# Patient Record
Sex: Female | Born: 1945
Health system: Southern US, Community
[De-identification: ages and names within clinical notes are randomized; demographics above are authoritative.]

## PROBLEM LIST (undated history)

## (undated) DIAGNOSIS — Z720 Tobacco use: Secondary | ICD-10-CM

## (undated) DIAGNOSIS — E559 Vitamin D deficiency, unspecified: Secondary | ICD-10-CM

## (undated) DIAGNOSIS — J439 Emphysema, unspecified: Secondary | ICD-10-CM

## (undated) DIAGNOSIS — C801 Malignant (primary) neoplasm, unspecified: Secondary | ICD-10-CM

## (undated) DIAGNOSIS — I700 Atherosclerosis of aorta: Secondary | ICD-10-CM

## (undated) DIAGNOSIS — E7800 Pure hypercholesterolemia, unspecified: Secondary | ICD-10-CM

## (undated) DIAGNOSIS — R222 Localized swelling, mass and lump, trunk: Secondary | ICD-10-CM

## (undated) HISTORY — PX: APPENDECTOMY: SHX54

## (undated) HISTORY — DX: Pure hypercholesterolemia, unspecified: E78.00

## (undated) HISTORY — DX: Emphysema, unspecified: J43.9

## (undated) HISTORY — DX: Localized swelling, mass and lump, trunk: R22.2

## (undated) HISTORY — DX: Atherosclerosis of aorta: I70.0

## (undated) HISTORY — DX: Vitamin D deficiency, unspecified: E55.9

## (undated) HISTORY — DX: Tobacco use: Z72.0

---

## 2008-10-20 HISTORY — PX: COLONOSCOPY: SHX174

## 2011-12-11 ENCOUNTER — Other Ambulatory Visit: Payer: Self-pay | Admitting: Family Medicine

## 2011-12-11 ENCOUNTER — Other Ambulatory Visit (HOSPITAL_COMMUNITY)
Admission: RE | Admit: 2011-12-11 | Discharge: 2011-12-11 | Disposition: A | Discharge status: Home / Self Care | Payer: Medicare Other | Source: Ambulatory Visit | Attending: Family Medicine | Admitting: Family Medicine

## 2011-12-11 DIAGNOSIS — Z124 Encounter for screening for malignant neoplasm of cervix: Secondary | ICD-10-CM | POA: Insufficient documentation

## 2011-12-11 DIAGNOSIS — Z1231 Encounter for screening mammogram for malignant neoplasm of breast: Secondary | ICD-10-CM

## 2011-12-25 ENCOUNTER — Ambulatory Visit
Admission: RE | Admit: 2011-12-25 | Discharge: 2011-12-25 | Disposition: A | Discharge status: Home / Self Care | Payer: Medicare Other | Source: Ambulatory Visit | Attending: Family Medicine | Admitting: Family Medicine

## 2011-12-25 DIAGNOSIS — Z1231 Encounter for screening mammogram for malignant neoplasm of breast: Secondary | ICD-10-CM

## 2013-05-03 ENCOUNTER — Other Ambulatory Visit: Payer: Self-pay

## 2013-05-03 DIAGNOSIS — Z1231 Encounter for screening mammogram for malignant neoplasm of breast: Secondary | ICD-10-CM

## 2013-05-31 ENCOUNTER — Ambulatory Visit: Payer: Medicare Other

## 2013-06-02 ENCOUNTER — Ambulatory Visit
Admission: RE | Admit: 2013-06-02 | Discharge: 2013-06-02 | Disposition: A | Discharge status: Home / Self Care | Payer: Medicare Other | Source: Ambulatory Visit

## 2013-06-02 DIAGNOSIS — Z1231 Encounter for screening mammogram for malignant neoplasm of breast: Secondary | ICD-10-CM

## 2014-05-01 ENCOUNTER — Other Ambulatory Visit: Payer: Self-pay | Admitting: Family Medicine

## 2014-05-01 ENCOUNTER — Other Ambulatory Visit (HOSPITAL_COMMUNITY)
Admission: RE | Admit: 2014-05-01 | Discharge: 2014-05-01 | Disposition: A | Discharge status: Home / Self Care | Payer: Medicare Other | Source: Ambulatory Visit | Attending: Family Medicine | Admitting: Family Medicine

## 2014-05-01 DIAGNOSIS — Z124 Encounter for screening for malignant neoplasm of cervix: Secondary | ICD-10-CM | POA: Insufficient documentation

## 2014-05-01 DIAGNOSIS — Z1151 Encounter for screening for human papillomavirus (HPV): Secondary | ICD-10-CM | POA: Insufficient documentation

## 2014-05-02 LAB — CYTOLOGY - PAP

## 2014-07-14 ENCOUNTER — Other Ambulatory Visit: Payer: Self-pay | Admitting: Gastroenterology

## 2016-05-09 ENCOUNTER — Other Ambulatory Visit: Payer: Self-pay | Admitting: Acute Care

## 2016-05-09 DIAGNOSIS — F17210 Nicotine dependence, cigarettes, uncomplicated: Secondary | ICD-10-CM

## 2016-06-12 ENCOUNTER — Ambulatory Visit (INDEPENDENT_AMBULATORY_CARE_PROVIDER_SITE_OTHER)
Admission: RE | Admit: 2016-06-12 | Discharge: 2016-06-12 | Disposition: A | Discharge status: Home / Self Care | Payer: Medicare Other | Source: Ambulatory Visit | Attending: Acute Care | Admitting: Acute Care

## 2016-06-12 ENCOUNTER — Encounter: Payer: Self-pay | Admitting: Acute Care

## 2016-06-12 ENCOUNTER — Ambulatory Visit (INDEPENDENT_AMBULATORY_CARE_PROVIDER_SITE_OTHER): Payer: Medicare Other | Admitting: Acute Care

## 2016-06-12 DIAGNOSIS — F17210 Nicotine dependence, cigarettes, uncomplicated: Secondary | ICD-10-CM

## 2016-06-12 DIAGNOSIS — Z87891 Personal history of nicotine dependence: Secondary | ICD-10-CM | POA: Diagnosis not present

## 2016-06-12 NOTE — Progress Notes (Signed)
Shared Decision Making Visit Lung Cancer Screening Program [phone])   Eligibility:  Age 70 y.o.  Pack Years Smoking History Calculation 52 pack years (# packs/per year x # years smoked)  Recent History of coughing up blood  no  Unexplained weight loss? no ( >Than 15 pounds within the last 6 months )  Prior History Lung / other cancer no (Diagnosis within the last 5 years already requiring surveillance chest CT Scans).  Smoking Status Current Smoker  Former Smokers: Years since quit: NA  Quit Date: NA  Visit Components:  Discussion included one or more decision making aids. yes  Discussion included risk/benefits of screening. yes  Discussion included potential follow up diagnostic testing for abnormal scans. yes  Discussion included meaning and risk of over diagnosis. yes  Discussion included meaning and risk of False Positives. yes  Discussion included meaning of total radiation exposure. yes  Counseling Included:  Importance of adherence to annual lung cancer LDCT screening. yes  Impact of comorbidities on ability to participate in the program. yes  Ability and willingness to under diagnostic treatment. yes  Smoking Cessation Counseling:  Current Smokers:   Discussed importance of smoking cessation. yes  Information about tobacco cessation classes and interventions provided to patient. yes  Patient provided with "ticket" for LDCT Scan. yes  Symptomatic Patient. no  Counseling  Diagnosis Code: Tobacco Use Z72.0  Asymptomatic Patient yes  Counseling (Intermediate counseling: > three minutes counseling) [id number]  Former Smokers:   Discussed the importance of maintaining cigarette abstinence. yes  Diagnosis Code: Personal History of Nicotine Dependence. [id number]  Information about tobacco cessation classes and interventions provided to patient. Yes  Patient provided with "ticket" for LDCT Scan. yes  Written Order for Lung Cancer Screening with LDCT  placed in Epic. Yes (CT Chest Lung Cancer Screening Low Dose W/O CM) [id number] Z12.2-Screening of respiratory organs Z87.891-Personal history of nicotine dependence  I have spent 20 minutes of face to face time with Ms. Azcarate discussing the risks and benefits of lung cancer screening. We viewed a power point together that explained in detail the above noted topics. We paused at intervals to allow for questions to be asked and answered to ensure understanding.We discussed that the single most powerful action that she can take to decrease her risk of developing lung cancer is to quit smoking. We discussed whether or not she is ready to commit to setting a quit date. She is currently not ready to set a quit date. We discussed options for tools to aid in quitting smoking including nicotine replacement therapy, non-nicotine medications, support groups, Quit Smart classes, and behavior modification. We discussed that often times setting smaller, more achievable goals, such as eliminating 1 cigarette a day for a week and then 2 cigarettes a day for a week can be helpful in slowly decreasing the number of cigarettes smoked. This allows for a sense of accomplishment as well as providing a clinical benefit. I gave her the " Be Stronger Than Your Excuses" card with contact information for community resources, classes, free nicotine replacement therapy, and access to mobile apps, text messaging, and on-line smoking cessation help. I have also given her my card and contact information in the event she needs to contact me. We discussed the time and location of the scan, and that either June Leap, CMA, or I will call with the results within 24-48 hours of receiving them. I have provided her with a copy of the power point we viewed  as a  resource in the event they need reinforcement of the concepts we discussed today in the office. The patient verbalized understanding of all of  the above and had no further questions upon  leaving the office. They have my contact information in the event they have any further questions.   Magdalen Spatz, NP 06/12/16

## 2016-08-06 ENCOUNTER — Telehealth: Payer: Self-pay | Admitting: Acute Care

## 2016-08-06 DIAGNOSIS — F17210 Nicotine dependence, cigarettes, uncomplicated: Secondary | ICD-10-CM

## 2016-08-06 NOTE — Telephone Encounter (Signed)
This is documentation of a phone call made 06/16/2016. Results of the low-dose screening CT were called to Mrs. Olivia Gomez. I explained that her scan was read as a lung RADS 2, indicating nodules that are benign in appearance or behavior. Recommendation per radiology is for continued annual lung cancer screening with low-dose CT in 12 months. We will call Olivia Gomez in August 2018 to schedule her for her annual scan. We discussed that her CT also indicated emphysema, and coronary artery and thoracic aortic atherosclerosis. I explained that the atherosclerosis was a very common finding with all of these scans. I explained that as a non-gated exam we were unable to determine severity or degree. I have told her we will fax the result her primary care provider for completeness of her medical care. Olivia Gomez verbalized understanding of the above and had no further questions at completion of the call. She has my contact information in the event she has questions in the future.

## 2017-05-14 ENCOUNTER — Other Ambulatory Visit: Payer: Self-pay | Admitting: Family Medicine

## 2017-05-14 DIAGNOSIS — Z1231 Encounter for screening mammogram for malignant neoplasm of breast: Secondary | ICD-10-CM

## 2017-05-20 ENCOUNTER — Ambulatory Visit
Admission: RE | Admit: 2017-05-20 | Discharge: 2017-05-20 | Disposition: A | Discharge status: Home / Self Care | Payer: Medicare Other | Source: Ambulatory Visit | Attending: Family Medicine | Admitting: Family Medicine

## 2017-05-20 DIAGNOSIS — Z1231 Encounter for screening mammogram for malignant neoplasm of breast: Secondary | ICD-10-CM

## 2017-06-15 ENCOUNTER — Ambulatory Visit (INDEPENDENT_AMBULATORY_CARE_PROVIDER_SITE_OTHER)
Admission: RE | Admit: 2017-06-15 | Discharge: 2017-06-15 | Disposition: A | Discharge status: Home / Self Care | Payer: Medicare Other | Source: Ambulatory Visit | Attending: Acute Care | Admitting: Acute Care

## 2017-06-15 DIAGNOSIS — F17210 Nicotine dependence, cigarettes, uncomplicated: Secondary | ICD-10-CM

## 2017-06-15 DIAGNOSIS — Z87891 Personal history of nicotine dependence: Secondary | ICD-10-CM

## 2017-06-18 ENCOUNTER — Other Ambulatory Visit: Payer: Self-pay | Admitting: Acute Care

## 2017-06-18 DIAGNOSIS — Z122 Encounter for screening for malignant neoplasm of respiratory organs: Secondary | ICD-10-CM

## 2017-06-18 DIAGNOSIS — F17210 Nicotine dependence, cigarettes, uncomplicated: Secondary | ICD-10-CM

## 2017-09-24 ENCOUNTER — Other Ambulatory Visit: Payer: Self-pay | Admitting: Family Medicine

## 2017-09-24 DIAGNOSIS — R222 Localized swelling, mass and lump, trunk: Secondary | ICD-10-CM

## 2017-10-01 ENCOUNTER — Other Ambulatory Visit: Payer: Medicare Other

## 2017-10-07 ENCOUNTER — Other Ambulatory Visit: Payer: Medicare Other

## 2017-10-15 ENCOUNTER — Ambulatory Visit
Admission: RE | Admit: 2017-10-15 | Discharge: 2017-10-15 | Disposition: A | Discharge status: Home / Self Care | Payer: Medicare Other | Source: Ambulatory Visit | Attending: Family Medicine | Admitting: Family Medicine

## 2017-10-15 DIAGNOSIS — R222 Localized swelling, mass and lump, trunk: Secondary | ICD-10-CM

## 2017-11-04 ENCOUNTER — Other Ambulatory Visit: Payer: Self-pay | Admitting: Family Medicine

## 2017-11-04 DIAGNOSIS — R222 Localized swelling, mass and lump, trunk: Secondary | ICD-10-CM

## 2017-11-11 ENCOUNTER — Ambulatory Visit
Admission: RE | Admit: 2017-11-11 | Discharge: 2017-11-11 | Disposition: A | Discharge status: Home / Self Care | Payer: Medicare HMO | Source: Ambulatory Visit | Attending: Family Medicine | Admitting: Family Medicine

## 2017-11-11 DIAGNOSIS — R918 Other nonspecific abnormal finding of lung field: Secondary | ICD-10-CM | POA: Diagnosis not present

## 2017-11-11 DIAGNOSIS — J439 Emphysema, unspecified: Secondary | ICD-10-CM | POA: Diagnosis not present

## 2017-11-11 DIAGNOSIS — R222 Localized swelling, mass and lump, trunk: Secondary | ICD-10-CM

## 2017-11-11 MED ORDER — IOPAMIDOL (ISOVUE-300) INJECTION 61%
75.0000 mL | Freq: Once | INTRAVENOUS | Status: AC | PRN
  Administered 2017-11-11: 75 mL via INTRAVENOUS

## 2017-11-23 DIAGNOSIS — R69 Illness, unspecified: Secondary | ICD-10-CM | POA: Diagnosis not present

## 2017-12-21 ENCOUNTER — Encounter (INDEPENDENT_AMBULATORY_CARE_PROVIDER_SITE_OTHER): Payer: Self-pay

## 2017-12-21 ENCOUNTER — Ambulatory Visit: Payer: Medicare HMO | Admitting: Interventional Cardiology

## 2017-12-21 ENCOUNTER — Encounter: Payer: Self-pay | Admitting: Interventional Cardiology

## 2017-12-21 VITALS — BP 142/86 | HR 62 | Ht 62.0 in | Wt 124.8 lb

## 2017-12-21 DIAGNOSIS — Z720 Tobacco use: Secondary | ICD-10-CM | POA: Diagnosis not present

## 2017-12-21 DIAGNOSIS — R931 Abnormal findings on diagnostic imaging of heart and coronary circulation: Secondary | ICD-10-CM

## 2017-12-21 DIAGNOSIS — I700 Atherosclerosis of aorta: Secondary | ICD-10-CM

## 2017-12-21 DIAGNOSIS — E785 Hyperlipidemia, unspecified: Secondary | ICD-10-CM | POA: Diagnosis not present

## 2017-12-21 NOTE — Progress Notes (Signed)
Cardiology Office Note    Date:  12/21/2017   ID:  Olivia Gomez, DOB Jan 20, 1946, MRN [id number]  PCP:  Marda Stalker, PA-C  Cardiologist: Sinclair Grooms, MD   Chief Complaint  Patient presents with  . Coronary Artery Disease    Asymptomatic CAD/coronary calcification    History of Present Illness:  Olivia Gomez is a 72 y.o. female with history of 1-2 packs of cigarettes per day for greater than 50 years, hyperlipidemia, elevated blood pressures without diagnosis of hypertension, and family history of CAD referred by Marda Stalker, PA-C for recommendations concerning coronary calcification identified coincidentally when CT of the chest was performed in late 2018.  The patient was referred to Dr. Wynonia Lawman in late 2018 where counseling and an exercise treadmill test was performed.  She was recommended to use aspirin daily, and exercise treadmill test demonstrated mild reduction in exertional tolerance, normal blood pressure response, no evidence of ischemia.  Since that time she has continued to smoke, has decreased aspirin to once a week, and is not in favor of statin therapy.  Statin therapy was recommended by Dr. Wynonia Lawman but not accepted.    Past Medical History:  Diagnosis Date  . Elevated cholesterol   . Emphysema lung ([place])   . Lump in chest   . Tobacco user   . Vitamin D deficiency     History reviewed. No pertinent surgical history.  Current Medications: Outpatient Medications Prior to Visit  Medication Sig Dispense Refill  . Ascorbic Acid (VITAMIN C) 1000 MG tablet Take 1,000 mg by mouth once a week.    Marland Kitchen aspirin EC 81 MG tablet Take 81 mg by mouth 3 (three) times a week.    Marland Kitchen b complex vitamins capsule Take 1 capsule by mouth once a week.    . Cholecalciferol (VITAMIN D) 2000 units CAPS Take 2,000 Units by mouth daily.    . Coenzyme Q10 (COQ10) 100 MG CAPS Take 100 mg by mouth once a week.    . Lactobacillus (ACIDOPHILUS PO) Take 1 capsule by mouth once a  week.     . Magnesium 250 MG TABS Take 250 mg by mouth once a week.     . NON FORMULARY Take 750 mg by mouth once a week. CURAMED    . Omega-3 Fatty Acids (FISH OIL PO) Take 1,200 mg by mouth daily.     Marland Kitchen OVER THE COUNTER MEDICATION Take 500 mg by mouth 2 (two) times daily. CHOLESTOFF    . Coenzyme Q10 (VITALINE COQ10) 60 MG TABS Take by mouth once a week.     No facility-administered medications prior to visit.      Allergies:   Flagyl [metronidazole]   Social History   Socioeconomic History  . Marital status: Unknown    Spouse name: None  . Number of children: None  . Years of education: None  . Highest education level: None  Social Needs  . Financial resource strain: None  . Food insecurity - worry: None  . Food insecurity - inability: None  . Transportation needs - medical: None  . Transportation needs - non-medical: None  Occupational History  . None  Tobacco Use  . Smoking status: Current Every Day Smoker    Packs/day: 1.00    Years: 52.00    Pack years: 52.00    Types: Cigarettes  . Smokeless tobacco: Never Used  Substance and Sexual Activity  . Alcohol use: None  . Drug use: None  . Sexual activity:  None    Comment: DIVORCED  Other Topics Concern  . None  Social History Narrative  . None     Family History:  The patient's family history includes Canavan disease in her maternal grandmother; Cancer in her paternal grandmother; Dementia in her mother; Heart failure in her father.   ROS:   Please see the history of present illness.    Hearing loss.  Otherwise no complaints.  Gardens on a regular basis. All other systems reviewed and are negative.   PHYSICAL EXAM:   VS:  BP (!) 142/86   Pulse 62   Ht 5\' 2"  (1.575 m)   Wt 124 lb 12.8 oz (56.6 kg)   BMI 22.83 kg/m    GEN: Well nourished, well developed, in no acute distress  HEENT: normal  Neck: no JVD, carotid bruits, or masses Cardiac: RRR; no murmurs, rubs, or gallops,no edema  Respiratory:  clear  to auscultation bilaterally, normal work of breathing GI: soft, nontender, nondistended, + BS MS: no deformity or atrophy  Skin: warm and dry, no rash Neuro:  Alert and Oriented x 3, Strength and sensation are intact Psych: euthymic mood, full affect  Wt Readings from Last 3 Encounters:  12/21/17 124 lb 12.8 oz (56.6 kg)      Studies/Labs Reviewed:   EKG:  EKG normal sinus rhythm with no change in EKG compared to records from [place].  Tall R wave in V1 is noted.  Nonspecific ST-T wave abnormality is noted.  Right superior axis was noted.  Recent Labs: No results found for requested labs within last 8760 hours.   Lipid Panel No results found for: CHOL, TRIG, HDL, CHOLHDL, VLDL, LDLCALC, LDLDIRECT  Additional studies/ records that were reviewed today include:  CT scan performed in October 2018 demonstrated aortic atherosclerosis and mild three-vessel coronary calcification    ASSESSMENT:    1. Aortic atherosclerosis ([place])   2. Tobacco abuse   3. Hyperlipidemia with target LDL less than 70   4. Elevated coronary artery calcium score      PLAN:  In order of problems listed above:  1. Noted on CT.  Asymptomatic without symptoms of claudication or other vascular complaints.  Not ready to 2. She is not willing to quit cigarette smoking at this time.  Smoking cessation will decrease her risk for vascular events over time. 3. LDL 123 when checked last summer.  Total cholesterol 230.  Statin therapy would decrease risk of vascular events.  She refuses statin therapy. 4. The presence of coronary calcification led to an exercise treadmill test within the past 4 months which was negative for evidence of ischemia.  No further evaluation is needed.  Risk modification as noted above was recommended.  To summarize, have recommended smoking cessation, statin therapy to LDL of 70 or less, and aspirin therapy at least 3 times per week.  It    Medication Adjustments/Labs and Tests  Ordered: Current medicines are reviewed at length with the patient today.  Concerns regarding medicines are outlined above.  Medication changes, Labs and Tests ordered today are listed in the Patient Instructions below. Patient Instructions  Medication Instructions:  1) INCREASE Aspirin to 81mg  three times weekly  Labwork: None  Testing/Procedures: None  Follow-Up: Your physician recommends that you schedule a follow-up appointment as needed with Dr. Tamala Julian.    Any Other Special Instructions Will Be Listed Below (If Applicable).  We recommend that you get your bad (LDL) cholesterol down to 70 or lower.  Steps to Quit Smoking Smoking tobacco can be harmful to your health and can affect almost every organ in your body. Smoking puts you, and those around you, at risk for developing many serious chronic diseases. Quitting smoking is difficult, but it is one of the best things that you can do for your health. It is never too late to quit. What are the benefits of quitting smoking? When you quit smoking, you lower your risk of developing serious diseases and conditions, such as:  Lung cancer or lung disease, such as COPD.  Heart disease.  Stroke.  Heart attack.  Infertility.  Osteoporosis and bone fractures.  Additionally, symptoms such as coughing, wheezing, and shortness of breath may get better when you quit. You may also find that you get sick less often because your body is stronger at fighting off colds and infections. If you are pregnant, quitting smoking can help to reduce your chances of having a baby of low birth weight. How do I get ready to quit? When you decide to quit smoking, create a plan to make sure that you are successful. Before you quit:  Pick a date to quit. Set a date within the next two weeks to give you time to prepare.  Write down the reasons why you are quitting. Keep this list in places where you will see it often, such as on your bathroom mirror or  in your car or wallet.  Identify the people, places, things, and activities that make you want to smoke (triggers) and avoid them. Make sure to take these actions: ? Throw away all cigarettes at home, at work, and in your car. ? Throw away smoking accessories, such as Scientist, research (medical). ? Clean your car and make sure to empty the ashtray. ? Clean your home, including curtains and carpets.  Tell your family, friends, and coworkers that you are quitting. Support from your loved ones can make quitting easier.  Talk with your health care provider about your options for quitting smoking.  Find out what treatment options are covered by your health insurance.  What strategies can I use to quit smoking? Talk with your healthcare provider about different strategies to quit smoking. Some strategies include:  Quitting smoking altogether instead of gradually lessening how much you smoke over a period of time. Research shows that quitting "cold [place]" is more successful than gradually quitting.  Attending in-person counseling to help you build problem-solving skills. You are more likely to have success in quitting if you attend several counseling sessions. Even short sessions of 10 minutes can be effective.  Finding resources and support systems that can help you to quit smoking and remain smoke-free after you quit. These resources are most helpful when you use them often. They can include: ? Online chats with a Social worker. ? Telephone quitlines. ? Careers information officer. ? Support groups or group counseling. ? Text messaging programs. ? Mobile phone applications.  Taking medicines to help you quit smoking. (If you are pregnant or breastfeeding, talk with your health care provider first.) Some medicines contain nicotine and some do not. Both types of medicines help with cravings, but the medicines that include nicotine help to relieve withdrawal symptoms. Your health care provider may  recommend: ? Nicotine patches, gum, or lozenges. ? Nicotine inhalers or sprays. ? Non-nicotine medicine that is taken by mouth.  Talk with your health care provider about combining strategies, such as taking medicines while you are also receiving in-person counseling. Using these two strategies  together makes you more likely to succeed in quitting than if you used either strategy on its own. If you are pregnant or breastfeeding, talk with your health care provider about finding counseling or other support strategies to quit smoking. Do not take medicine to help you quit smoking unless told to do so by your health care provider. What things can I do to make it easier to quit? Quitting smoking might feel overwhelming at first, but there is a lot that you can do to make it easier. Take these important actions:  Reach out to your family and friends and ask that they support and encourage you during this time. Call telephone quitlines, reach out to support groups, or work with a counselor for support.  Ask people who smoke to avoid smoking around you.  Avoid places that trigger you to smoke, such as bars, parties, or smoke-break areas at work.  Spend time around people who do not smoke.  Lessen stress in your life, because stress can be a smoking trigger for some people. To lessen stress, try: ? Exercising regularly. ? Deep-breathing exercises. ? Yoga. ? Meditating. ? Performing a body scan. This involves closing your eyes, scanning your body from head to toe, and noticing which parts of your body are particularly tense. Purposefully relax the muscles in those areas.  Download or purchase mobile phone or tablet apps (applications) that can help you stick to your quit plan by providing reminders, tips, and encouragement. There are many free apps, such as QuitGuide from the [employer] Office manager for Disease Control and Prevention). You can find other support for quitting smoking (smoking cessation) through  smokefree.gov and other websites.  How will I feel when I quit smoking? Within the first 24 hours of quitting smoking, you may start to feel some withdrawal symptoms. These symptoms are usually most noticeable 2-3 days after quitting, but they usually do not last beyond 2-3 weeks. Changes or symptoms that you might experience include:  Mood swings.  Restlessness, anxiety, or irritation.  Difficulty concentrating.  Dizziness.  Strong cravings for sugary foods in addition to nicotine.  Mild weight gain.  Constipation.  Nausea.  Coughing or a sore throat.  Changes in how your medicines work in your body.  A depressed mood.  Difficulty sleeping (insomnia).  After the first 2-3 weeks of quitting, you may start to notice more positive results, such as:  Improved sense of smell and taste.  Decreased coughing and sore throat.  Slower heart rate.  Lower blood pressure.  Clearer skin.  The ability to breathe more easily.  Fewer sick days.  Quitting smoking is very challenging for most people. Do not get discouraged if you are not successful the first time. Some people need to make many attempts to quit before they achieve long-term success. Do your best to stick to your quit plan, and talk with your health care provider if you have any questions or concerns. This information is not intended to replace advice given to you by your health care provider. Make sure you discuss any questions you have with your health care provider. Document Released: 09/30/2001 Document Revised: 06/03/2016 Document Reviewed: 02/20/2015 Elsevier Interactive Patient Education  Claris Pech [employer]in.    If you need a refill on your cardiac medications before your next appointment, please call your pharmacy.      Signed, Sinclair Grooms, MD  12/21/2017 9:57 AM    [place], [place]  [address] Phone: [phone]; Fax: [phone]

## 2017-12-21 NOTE — Patient Instructions (Signed)
Medication Instructions:  1) INCREASE Aspirin to 81mg  three times weekly  Labwork: None  Testing/Procedures: None  Follow-Up: Your physician recommends that you schedule a follow-up appointment as needed with Dr. Tamala Julian.    Any Other Special Instructions Will Be Listed Below (If Applicable).  We recommend that you get your bad (LDL) cholesterol down to 70 or lower.     Steps to Quit Smoking Smoking tobacco can be harmful to your health and can affect almost every organ in your body. Smoking puts you, and those around you, at risk for developing many serious chronic diseases. Quitting smoking is difficult, but it is one of the best things that you can do for your health. It is never too late to quit. What are the benefits of quitting smoking? When you quit smoking, you lower your risk of developing serious diseases and conditions, such as:  Lung cancer or lung disease, such as COPD.  Heart disease.  Stroke.  Heart attack.  Infertility.  Osteoporosis and bone fractures.  Additionally, symptoms such as coughing, wheezing, and shortness of breath may get better when you quit. You may also find that you get sick less often because your body is stronger at fighting off colds and infections. If you are pregnant, quitting smoking can help to reduce your chances of having a baby of low birth weight. How do I get ready to quit? When you decide to quit smoking, create a plan to make sure that you are successful. Before you quit:  Pick a date to quit. Set a date within the next two weeks to give you time to prepare.  Write down the reasons why you are quitting. Keep this list in places where you will see it often, such as on your bathroom mirror or in your car or wallet.  Identify the people, places, things, and activities that make you want to smoke (triggers) and avoid them. Make sure to take these actions: ? Throw away all cigarettes at home, at work, and in your car. ? Throw away  smoking accessories, such as Scientist, research (medical). ? Clean your car and make sure to empty the ashtray. ? Clean your home, including curtains and carpets.  Tell your family, friends, and coworkers that you are quitting. Support from your loved ones can make quitting easier.  Talk with your health care provider about your options for quitting smoking.  Find out what treatment options are covered by your health insurance.  What strategies can I use to quit smoking? Talk with your healthcare provider about different strategies to quit smoking. Some strategies include:  Quitting smoking altogether instead of gradually lessening how much you smoke over a period of time. Research shows that quitting "cold [place]" is more successful than gradually quitting.  Attending in-person counseling to help you build problem-solving skills. You are more likely to have success in quitting if you attend several counseling sessions. Even short sessions of 10 minutes can be effective.  Finding resources and support systems that can help you to quit smoking and remain smoke-free after you quit. These resources are most helpful when you use them often. They can include: ? Online chats with a Social worker. ? Telephone quitlines. ? Careers information officer. ? Support groups or group counseling. ? Text messaging programs. ? Mobile phone applications.  Taking medicines to help you quit smoking. (If you are pregnant or breastfeeding, talk with your health care provider first.) Some medicines contain nicotine and some do not. Both types of medicines  help with cravings, but the medicines that include nicotine help to relieve withdrawal symptoms. Your health care provider may recommend: ? Nicotine patches, gum, or lozenges. ? Nicotine inhalers or sprays. ? Non-nicotine medicine that is taken by mouth.  Talk with your health care provider about combining strategies, such as taking medicines while you are also  receiving in-person counseling. Using these two strategies together makes you more likely to succeed in quitting than if you used either strategy on its own. If you are pregnant or breastfeeding, talk with your health care provider about finding counseling or other support strategies to quit smoking. Do not take medicine to help you quit smoking unless told to do so by your health care provider. What things can I do to make it easier to quit? Quitting smoking might feel overwhelming at first, but there is a lot that you can do to make it easier. Take these important actions:  Reach out to your family and friends and ask that they support and encourage you during this time. Call telephone quitlines, reach out to support groups, or work with a counselor for support.  Ask people who smoke to avoid smoking around you.  Avoid places that trigger you to smoke, such as bars, parties, or smoke-break areas at work.  Spend time around people who do not smoke.  Lessen stress in your life, because stress can be a smoking trigger for some people. To lessen stress, try: ? Exercising regularly. ? Deep-breathing exercises. ? Yoga. ? Meditating. ? Performing a body scan. This involves closing your eyes, scanning your body from head to toe, and noticing which parts of your body are particularly tense. Purposefully relax the muscles in those areas.  Download or purchase mobile phone or tablet apps (applications) that can help you stick to your quit plan by providing reminders, tips, and encouragement. There are many free apps, such as QuitGuide from the [employer] Office manager for Disease Control and Prevention). You can find other support for quitting smoking (smoking cessation) through smokefree.gov and other websites.  How will I feel when I quit smoking? Within the first 24 hours of quitting smoking, you may start to feel some withdrawal symptoms. These symptoms are usually most noticeable 2-3 days after quitting, but  they usually do not last beyond 2-3 weeks. Changes or symptoms that you might experience include:  Mood swings.  Restlessness, anxiety, or irritation.  Difficulty concentrating.  Dizziness.  Strong cravings for sugary foods in addition to nicotine.  Mild weight gain.  Constipation.  Nausea.  Coughing or a sore throat.  Changes in how your medicines work in your body.  A depressed mood.  Difficulty sleeping (insomnia).  After the first 2-3 weeks of quitting, you may start to notice more positive results, such as:  Improved sense of smell and taste.  Decreased coughing and sore throat.  Slower heart rate.  Lower blood pressure.  Clearer skin.  The ability to breathe more easily.  Fewer sick days.  Quitting smoking is very challenging for most people. Do not get discouraged if you are not successful the first time. Some people need to make many attempts to quit before they achieve long-term success. Do your best to stick to your quit plan, and talk with your health care provider if you have any questions or concerns. This information is not intended to replace advice given to you by your health care provider. Make sure you discuss any questions you have with your health care provider. Document Released:  09/30/2001 Document Revised: 06/03/2016 Document Reviewed: 02/20/2015 Elsevier Interactive Patient Education  [employer].    If you need a refill on your cardiac medications before your next appointment, please call your pharmacy.

## 2018-03-11 DIAGNOSIS — H2513 Age-related nuclear cataract, bilateral: Secondary | ICD-10-CM | POA: Diagnosis not present

## 2018-03-11 DIAGNOSIS — H524 Presbyopia: Secondary | ICD-10-CM | POA: Diagnosis not present

## 2018-05-11 DIAGNOSIS — R69 Illness, unspecified: Secondary | ICD-10-CM | POA: Diagnosis not present

## 2018-05-19 DIAGNOSIS — J439 Emphysema, unspecified: Secondary | ICD-10-CM | POA: Diagnosis not present

## 2018-05-19 DIAGNOSIS — Z1211 Encounter for screening for malignant neoplasm of colon: Secondary | ICD-10-CM | POA: Diagnosis not present

## 2018-05-19 DIAGNOSIS — E559 Vitamin D deficiency, unspecified: Secondary | ICD-10-CM | POA: Diagnosis not present

## 2018-05-19 DIAGNOSIS — E7800 Pure hypercholesterolemia, unspecified: Secondary | ICD-10-CM | POA: Diagnosis not present

## 2018-05-19 DIAGNOSIS — Z720 Tobacco use: Secondary | ICD-10-CM | POA: Diagnosis not present

## 2018-05-19 DIAGNOSIS — Z131 Encounter for screening for diabetes mellitus: Secondary | ICD-10-CM | POA: Diagnosis not present

## 2018-05-19 DIAGNOSIS — Z0000 Encounter for general adult medical examination without abnormal findings: Secondary | ICD-10-CM | POA: Diagnosis not present

## 2018-06-16 ENCOUNTER — Ambulatory Visit
Admission: RE | Admit: 2018-06-16 | Discharge: 2018-06-16 | Disposition: A | Discharge status: Home / Self Care | Payer: Medicare HMO | Source: Ambulatory Visit | Attending: Acute Care | Admitting: Acute Care

## 2018-06-16 DIAGNOSIS — R69 Illness, unspecified: Secondary | ICD-10-CM | POA: Diagnosis not present

## 2018-06-16 DIAGNOSIS — F17210 Nicotine dependence, cigarettes, uncomplicated: Secondary | ICD-10-CM

## 2018-06-16 DIAGNOSIS — Z122 Encounter for screening for malignant neoplasm of respiratory organs: Secondary | ICD-10-CM

## 2018-06-22 ENCOUNTER — Other Ambulatory Visit: Payer: Self-pay | Admitting: Acute Care

## 2018-06-22 DIAGNOSIS — Z122 Encounter for screening for malignant neoplasm of respiratory organs: Secondary | ICD-10-CM

## 2018-06-22 DIAGNOSIS — F17210 Nicotine dependence, cigarettes, uncomplicated: Secondary | ICD-10-CM

## 2018-07-08 DIAGNOSIS — Z23 Encounter for immunization: Secondary | ICD-10-CM | POA: Diagnosis not present

## 2018-08-06 DIAGNOSIS — R0781 Pleurodynia: Secondary | ICD-10-CM | POA: Diagnosis not present

## 2018-09-22 DIAGNOSIS — H2513 Age-related nuclear cataract, bilateral: Secondary | ICD-10-CM | POA: Diagnosis not present

## 2018-11-01 DIAGNOSIS — R69 Illness, unspecified: Secondary | ICD-10-CM | POA: Diagnosis not present

## 2018-11-06 DIAGNOSIS — Z0100 Encounter for examination of eyes and vision without abnormal findings: Secondary | ICD-10-CM | POA: Diagnosis not present

## 2019-05-04 DIAGNOSIS — R69 Illness, unspecified: Secondary | ICD-10-CM | POA: Diagnosis not present

## 2019-06-21 DIAGNOSIS — R69 Illness, unspecified: Secondary | ICD-10-CM | POA: Diagnosis not present

## 2019-06-21 DIAGNOSIS — H524 Presbyopia: Secondary | ICD-10-CM | POA: Diagnosis not present

## 2019-08-11 DIAGNOSIS — D229 Melanocytic nevi, unspecified: Secondary | ICD-10-CM | POA: Diagnosis not present

## 2019-08-11 DIAGNOSIS — Z1211 Encounter for screening for malignant neoplasm of colon: Secondary | ICD-10-CM | POA: Diagnosis not present

## 2019-08-11 DIAGNOSIS — Z0000 Encounter for general adult medical examination without abnormal findings: Secondary | ICD-10-CM | POA: Diagnosis not present

## 2019-08-11 DIAGNOSIS — E559 Vitamin D deficiency, unspecified: Secondary | ICD-10-CM | POA: Diagnosis not present

## 2019-08-11 DIAGNOSIS — E7800 Pure hypercholesterolemia, unspecified: Secondary | ICD-10-CM | POA: Diagnosis not present

## 2019-08-11 DIAGNOSIS — J439 Emphysema, unspecified: Secondary | ICD-10-CM | POA: Diagnosis not present

## 2019-08-11 DIAGNOSIS — Z720 Tobacco use: Secondary | ICD-10-CM | POA: Diagnosis not present

## 2019-09-05 DIAGNOSIS — L821 Other seborrheic keratosis: Secondary | ICD-10-CM | POA: Diagnosis not present

## 2019-11-10 DIAGNOSIS — R69 Illness, unspecified: Secondary | ICD-10-CM | POA: Diagnosis not present

## 2020-01-08 DIAGNOSIS — R109 Unspecified abdominal pain: Secondary | ICD-10-CM | POA: Diagnosis not present

## 2020-01-08 DIAGNOSIS — R1084 Generalized abdominal pain: Secondary | ICD-10-CM | POA: Diagnosis not present

## 2020-01-26 ENCOUNTER — Other Ambulatory Visit: Payer: Self-pay | Admitting: Gastroenterology

## 2020-01-26 DIAGNOSIS — D72118 Other hypereosinophilic syndrome: Secondary | ICD-10-CM

## 2020-01-26 DIAGNOSIS — Z86010 Personal history of colonic polyps: Secondary | ICD-10-CM | POA: Diagnosis not present

## 2020-01-26 DIAGNOSIS — R109 Unspecified abdominal pain: Secondary | ICD-10-CM | POA: Diagnosis not present

## 2020-01-26 DIAGNOSIS — R197 Diarrhea, unspecified: Secondary | ICD-10-CM | POA: Diagnosis not present

## 2020-01-26 DIAGNOSIS — R198 Other specified symptoms and signs involving the digestive system and abdomen: Secondary | ICD-10-CM | POA: Diagnosis not present

## 2020-01-26 DIAGNOSIS — R700 Elevated erythrocyte sedimentation rate: Secondary | ICD-10-CM

## 2020-01-26 DIAGNOSIS — R7989 Other specified abnormal findings of blood chemistry: Secondary | ICD-10-CM

## 2020-01-26 DIAGNOSIS — R194 Change in bowel habit: Secondary | ICD-10-CM

## 2020-01-30 ENCOUNTER — Ambulatory Visit
Admission: RE | Admit: 2020-01-30 | Discharge: 2020-01-30 | Disposition: A | Discharge status: Home / Self Care | Payer: Medicare HMO | Source: Ambulatory Visit | Attending: Gastroenterology | Admitting: Gastroenterology

## 2020-01-30 ENCOUNTER — Other Ambulatory Visit: Payer: Self-pay

## 2020-01-30 DIAGNOSIS — D72118 Other hypereosinophilic syndrome: Secondary | ICD-10-CM

## 2020-01-30 DIAGNOSIS — R109 Unspecified abdominal pain: Secondary | ICD-10-CM

## 2020-01-30 DIAGNOSIS — R194 Change in bowel habit: Secondary | ICD-10-CM

## 2020-01-30 DIAGNOSIS — R700 Elevated erythrocyte sedimentation rate: Secondary | ICD-10-CM

## 2020-01-30 DIAGNOSIS — R7989 Other specified abnormal findings of blood chemistry: Secondary | ICD-10-CM

## 2020-01-30 DIAGNOSIS — R197 Diarrhea, unspecified: Secondary | ICD-10-CM | POA: Diagnosis not present

## 2020-01-30 MED ORDER — IOPAMIDOL (ISOVUE-300) INJECTION 61%
100.0000 mL | Freq: Once | INTRAVENOUS | Status: AC | PRN
  Administered 2020-01-30: 10:00:00 100 mL via INTRAVENOUS

## 2020-01-31 ENCOUNTER — Other Ambulatory Visit (HOSPITAL_COMMUNITY): Payer: Self-pay | Admitting: Gastroenterology

## 2020-01-31 ENCOUNTER — Telehealth (HOSPITAL_COMMUNITY): Payer: Self-pay

## 2020-01-31 DIAGNOSIS — R188 Other ascites: Secondary | ICD-10-CM

## 2020-01-31 DIAGNOSIS — R197 Diarrhea, unspecified: Secondary | ICD-10-CM | POA: Diagnosis not present

## 2020-01-31 NOTE — Telephone Encounter (Signed)
-----   Message from Greggory Keen, MD sent at 01/30/2020  3:37 PM EDT ----- Regarding: RE: [place] for CT drain (left transgluteal approach)  Ts  ----- Message ----- From: Danielle Dess Sent: 01/30/2020   2:36 PM EDT To: [facility] Procedure Requests Subject: Drain Placement                                Procedure: Diverticular abscess drain placement  Dx: 4.5cm fluid collection  Ordering: Dr. Ronnette Juniper - [phone]  Imaging: CT abd/pelvis done today, 01/30/20  Please review.  Thanks, Lia Foyer

## 2020-02-02 DIAGNOSIS — K5792 Diverticulitis of intestine, part unspecified, without perforation or abscess without bleeding: Secondary | ICD-10-CM | POA: Diagnosis not present

## 2020-02-09 ENCOUNTER — Other Ambulatory Visit: Payer: Medicare HMO

## 2020-02-10 ENCOUNTER — Other Ambulatory Visit: Payer: Self-pay | Admitting: Gastroenterology

## 2020-02-10 DIAGNOSIS — K5720 Diverticulitis of large intestine with perforation and abscess without bleeding: Secondary | ICD-10-CM

## 2020-02-14 ENCOUNTER — Other Ambulatory Visit: Payer: Medicare HMO

## 2020-02-14 ENCOUNTER — Ambulatory Visit
Admission: RE | Admit: 2020-02-14 | Discharge: 2020-02-14 | Disposition: A | Discharge status: Home / Self Care | Payer: Medicare HMO | Source: Ambulatory Visit | Attending: Gastroenterology | Admitting: Gastroenterology

## 2020-02-14 DIAGNOSIS — K5732 Diverticulitis of large intestine without perforation or abscess without bleeding: Secondary | ICD-10-CM | POA: Diagnosis not present

## 2020-02-14 DIAGNOSIS — K5720 Diverticulitis of large intestine with perforation and abscess without bleeding: Secondary | ICD-10-CM

## 2020-02-14 MED ORDER — IOPAMIDOL (ISOVUE-300) INJECTION 61%
100.0000 mL | Freq: Once | INTRAVENOUS | Status: AC | PRN
  Administered 2020-02-14: 100 mL via INTRAVENOUS

## 2020-02-16 ENCOUNTER — Other Ambulatory Visit: Payer: Self-pay | Admitting: Gastroenterology

## 2020-02-16 DIAGNOSIS — K630 Abscess of intestine: Secondary | ICD-10-CM

## 2020-02-16 DIAGNOSIS — K639 Disease of intestine, unspecified: Secondary | ICD-10-CM

## 2020-02-20 DIAGNOSIS — R69 Illness, unspecified: Secondary | ICD-10-CM | POA: Diagnosis not present

## 2020-03-05 ENCOUNTER — Encounter (HOSPITAL_COMMUNITY): Payer: Self-pay | Admitting: Gastroenterology

## 2020-03-05 ENCOUNTER — Inpatient Hospital Stay (HOSPITAL_COMMUNITY)
Admission: EM | Admit: 2020-03-05 | Discharge: 2020-03-08 | DRG: 392 | Disposition: A | Discharge status: Home / Self Care | Payer: Medicare HMO | Attending: Internal Medicine | Admitting: Internal Medicine

## 2020-03-05 ENCOUNTER — Ambulatory Visit
Admission: RE | Admit: 2020-03-05 | Discharge: 2020-03-05 | Disposition: A | Discharge status: Home / Self Care | Payer: Medicare HMO | Source: Ambulatory Visit | Attending: Gastroenterology | Admitting: Gastroenterology

## 2020-03-05 DIAGNOSIS — E559 Vitamin D deficiency, unspecified: Secondary | ICD-10-CM | POA: Diagnosis not present

## 2020-03-05 DIAGNOSIS — Z20822 Contact with and (suspected) exposure to covid-19: Secondary | ICD-10-CM | POA: Diagnosis not present

## 2020-03-05 DIAGNOSIS — R69 Illness, unspecified: Secondary | ICD-10-CM | POA: Diagnosis not present

## 2020-03-05 DIAGNOSIS — E162 Hypoglycemia, unspecified: Secondary | ICD-10-CM | POA: Diagnosis not present

## 2020-03-05 DIAGNOSIS — Z86010 Personal history of colonic polyps: Secondary | ICD-10-CM | POA: Diagnosis not present

## 2020-03-05 DIAGNOSIS — E785 Hyperlipidemia, unspecified: Secondary | ICD-10-CM | POA: Diagnosis present

## 2020-03-05 DIAGNOSIS — K5792 Diverticulitis of intestine, part unspecified, without perforation or abscess without bleeding: Secondary | ICD-10-CM | POA: Diagnosis not present

## 2020-03-05 DIAGNOSIS — Z883 Allergy status to other anti-infective agents status: Secondary | ICD-10-CM

## 2020-03-05 DIAGNOSIS — N1330 Unspecified hydronephrosis: Secondary | ICD-10-CM | POA: Diagnosis present

## 2020-03-05 DIAGNOSIS — F17210 Nicotine dependence, cigarettes, uncomplicated: Secondary | ICD-10-CM | POA: Diagnosis present

## 2020-03-05 DIAGNOSIS — K5720 Diverticulitis of large intestine with perforation and abscess without bleeding: Secondary | ICD-10-CM | POA: Diagnosis not present

## 2020-03-05 DIAGNOSIS — L0291 Cutaneous abscess, unspecified: Secondary | ICD-10-CM

## 2020-03-05 DIAGNOSIS — Z7982 Long term (current) use of aspirin: Secondary | ICD-10-CM | POA: Diagnosis not present

## 2020-03-05 DIAGNOSIS — R933 Abnormal findings on diagnostic imaging of other parts of digestive tract: Secondary | ICD-10-CM | POA: Diagnosis not present

## 2020-03-05 DIAGNOSIS — D72829 Elevated white blood cell count, unspecified: Secondary | ICD-10-CM

## 2020-03-05 DIAGNOSIS — K630 Abscess of intestine: Secondary | ICD-10-CM | POA: Diagnosis not present

## 2020-03-05 DIAGNOSIS — Z800 Family history of malignant neoplasm of digestive organs: Secondary | ICD-10-CM | POA: Diagnosis not present

## 2020-03-05 DIAGNOSIS — K5732 Diverticulitis of large intestine without perforation or abscess without bleeding: Secondary | ICD-10-CM | POA: Diagnosis not present

## 2020-03-05 DIAGNOSIS — K639 Disease of intestine, unspecified: Secondary | ICD-10-CM

## 2020-03-05 DIAGNOSIS — E872 Acidosis: Secondary | ICD-10-CM | POA: Diagnosis not present

## 2020-03-05 DIAGNOSIS — J439 Emphysema, unspecified: Secondary | ICD-10-CM | POA: Diagnosis present

## 2020-03-05 DIAGNOSIS — Z03818 Encounter for observation for suspected exposure to other biological agents ruled out: Secondary | ICD-10-CM | POA: Diagnosis not present

## 2020-03-05 DIAGNOSIS — K5700 Diverticulitis of small intestine with perforation and abscess without bleeding: Secondary | ICD-10-CM | POA: Diagnosis not present

## 2020-03-05 DIAGNOSIS — Z79899 Other long term (current) drug therapy: Secondary | ICD-10-CM

## 2020-03-05 LAB — CBC
HCT: 35 % — ABNORMAL LOW (ref 36.0–46.0)
Hemoglobin: 11.3 g/dL — ABNORMAL LOW (ref 12.0–15.0)
MCH: 30.9 pg (ref 26.0–34.0)
MCHC: 32.3 g/dL (ref 30.0–36.0)
MCV: 95.6 fL (ref 80.0–100.0)
Platelets: 404 10*3/uL — ABNORMAL HIGH (ref 150–400)
RBC: 3.66 MIL/uL — ABNORMAL LOW (ref 3.87–5.11)
RDW: 13.4 % (ref 11.5–15.5)
WBC: 18.3 10*3/uL — ABNORMAL HIGH (ref 4.0–10.5)
nRBC: 0 % (ref 0.0–0.2)

## 2020-03-05 LAB — CBC WITH DIFFERENTIAL/PLATELET
Abs Immature Granulocytes: 0.05 10*3/uL (ref 0.00–0.07)
Basophils Absolute: 0.1 10*3/uL (ref 0.0–0.1)
Basophils Relative: 0 %
Eosinophils Absolute: 0.1 10*3/uL (ref 0.0–0.5)
Eosinophils Relative: 1 %
HCT: 37.7 % (ref 36.0–46.0)
Hemoglobin: 12.2 g/dL (ref 12.0–15.0)
Immature Granulocytes: 0 %
Lymphocytes Relative: 11 %
Lymphs Abs: 1.9 10*3/uL (ref 0.7–4.0)
MCH: 31.1 pg (ref 26.0–34.0)
MCHC: 32.4 g/dL (ref 30.0–36.0)
MCV: 96.2 fL (ref 80.0–100.0)
Monocytes Absolute: 1.6 10*3/uL — ABNORMAL HIGH (ref 0.1–1.0)
Monocytes Relative: 9 %
Neutro Abs: 14 10*3/uL — ABNORMAL HIGH (ref 1.7–7.7)
Neutrophils Relative %: 79 %
Platelets: 434 10*3/uL — ABNORMAL HIGH (ref 150–400)
RBC: 3.92 MIL/uL (ref 3.87–5.11)
RDW: 13.5 % (ref 11.5–15.5)
WBC: 17.7 10*3/uL — ABNORMAL HIGH (ref 4.0–10.5)
nRBC: 0 % (ref 0.0–0.2)

## 2020-03-05 LAB — CREATININE, SERUM
Creatinine, Ser: 0.65 mg/dL (ref 0.44–1.00)
GFR calc Af Amer: >60 mL/min (ref >60)
GFR calc non Af Amer: >60 mL/min (ref >60)

## 2020-03-05 LAB — COMPREHENSIVE METABOLIC PANEL
ALT: 14 U/L (ref 0–44)
AST: 21 U/L (ref 15–41)
Albumin: 3.9 g/dL (ref 3.5–5.0)
Alkaline Phosphatase: 76 U/L (ref 38–126)
Anion gap: 10 (ref 5–15)
BUN: 10 mg/dL (ref 8–23)
CO2: 26 mmol/L (ref 22–32)
Calcium: 9.5 mg/dL (ref 8.9–10.3)
Chloride: 100 mmol/L (ref 98–111)
Creatinine, Ser: 0.58 mg/dL (ref 0.44–1.00)
GFR calc Af Amer: >60 mL/min (ref >60)
GFR calc non Af Amer: >60 mL/min (ref >60)
Glucose, Bld: 104 mg/dL — ABNORMAL HIGH (ref 70–99)
Potassium: 3.4 mmol/L — ABNORMAL LOW (ref 3.5–5.1)
Sodium: 136 mmol/L (ref 135–145)
Total Bilirubin: 0.6 mg/dL (ref 0.3–1.2)
Total Protein: 7.2 g/dL (ref 6.5–8.1)

## 2020-03-05 LAB — SARS CORONAVIRUS 2 BY RT PCR (HOSPITAL ORDER, PERFORMED IN ~~LOC~~ HOSPITAL LAB): SARS Coronavirus 2: NEGATIVE

## 2020-03-05 MED ORDER — IOPAMIDOL (ISOVUE-300) INJECTION 61%
100.0000 mL | Freq: Once | INTRAVENOUS | Status: AC | PRN
  Administered 2020-03-05: 100 mL via INTRAVENOUS

## 2020-03-05 MED ORDER — ONDANSETRON HCL 4 MG/2ML IJ SOLN: 4.0000 mg | Freq: Four times a day (QID) | INTRAMUSCULAR | Status: DC | PRN

## 2020-03-05 MED ORDER — ACETAMINOPHEN 325 MG PO TABS: 650.0000 mg | ORAL_TABLET | Freq: Four times a day (QID) | ORAL | Status: DC | PRN

## 2020-03-05 MED ORDER — ENOXAPARIN SODIUM 40 MG/0.4ML ~~LOC~~ SOLN
40.0000 mg | SUBCUTANEOUS | Status: DC
  Administered 2020-03-05 – 2020-03-07 (×3): 40 mg via SUBCUTANEOUS
  Filled 2020-03-05 (×3): qty 0.4

## 2020-03-05 MED ORDER — PIPERACILLIN-TAZOBACTAM 3.375 G IVPB 30 MIN
3.3750 g | Freq: Once | INTRAVENOUS | Status: AC
  Administered 2020-03-05: 3.375 g via INTRAVENOUS
  Filled 2020-03-05: qty 50

## 2020-03-05 MED ORDER — ACETAMINOPHEN 650 MG RE SUPP: 650.0000 mg | Freq: Four times a day (QID) | RECTAL | Status: DC | PRN

## 2020-03-05 MED ORDER — PIPERACILLIN-TAZOBACTAM 3.375 G IVPB
3.3750 g | Freq: Three times a day (TID) | INTRAVENOUS | Status: DC
  Administered 2020-03-06 – 2020-03-08 (×8): 3.375 g via INTRAVENOUS
  Filled 2020-03-05 (×9): qty 50

## 2020-03-05 MED ORDER — NICOTINE 21 MG/24HR TD PT24: 21.0000 mg | MEDICATED_PATCH | Freq: Every day | TRANSDERMAL | Status: DC

## 2020-03-05 MED ORDER — SODIUM CHLORIDE 0.9 % IV SOLN: INTRAVENOUS | Status: DC

## 2020-03-05 MED ORDER — ONDANSETRON HCL 4 MG PO TABS: 4.0000 mg | ORAL_TABLET | Freq: Four times a day (QID) | ORAL | Status: DC | PRN

## 2020-03-05 MED ORDER — POTASSIUM CHLORIDE 10 MEQ/100ML IV SOLN
10.0000 meq | Freq: Once | INTRAVENOUS | Status: AC
  Administered 2020-03-05: 10 meq via INTRAVENOUS
  Filled 2020-03-05: qty 100

## 2020-03-05 NOTE — ED Notes (Signed)
Pt ambulatory to bathroom, standby assistance.  

## 2020-03-05 NOTE — H&P (Signed)
TRH H&P    Patient Demographics:    Olivia Gomez, is a 74 y.o. female  MRN: [id number]  DOB - [birth year]  Admit Date - 03/05/2020  Referring MD/NP/PA: Madalyn Rob  Outpatient Primary MD for the patient is Marda Stalker, PA-C  Patient coming from: Home  Chief complaint-abnormal CT scan   HPI:    Olivia Gomez  is a 74 y.o. female, with medical history of hyperlipidemia, tobacco use, emphysema came to hospital with abnormal CT scan, patient has been having intermittent abdominal cramping since March.  She has been followed by [facility].  CT scan showed diverticulitis/colitis possible abscess formation.  She completed course of antibiotics Augmentin.  Repeat CT was improving.  However CT scan performed today shows worsening perforation with abscess formation.  She was called by the GI office and told patient to go to the ED for further evaluation. Today in the ED patient denies any symptoms.  Denies nausea vomiting or diarrhea.  Had 3 small BM this morning, formed stool after she had CT scan of the abdomen. She denies fever or chills. Denies chest pain or shortness of breath. Denies dysuria  In the ED, patient was started on IV Zosyn.  Gastroenterology was consulted.     Review of systems:    In addition to the HPI above,    All other systems reviewed and are negative.    Past History of the following :    Past Medical History:  Diagnosis Date  . Elevated cholesterol   . Emphysema lung ([place])   . Lump in chest   . Tobacco user   . Vitamin D deficiency       History reviewed. No pertinent surgical history.    Social History:      Social History   Tobacco Use  . Smoking status: Current Every Day Smoker    Packs/day: 1.00    Years: 52.00    Pack years: 52.00    Types: Cigarettes  . Smokeless tobacco: Never Used  Substance Use Topics  . Alcohol use: Not on file       Family  History :     Family History  Problem Relation Age of Onset  . Dementia Mother   . Heart failure Father   . [place]avan disease Maternal Grandmother        BREAST  . Cancer Paternal Grandmother        INTESTINAL      Home Medications:   Prior to Admission medications   Medication Sig Start Date End Date Taking? Authorizing Provider  Ascorbic Acid (VITAMIN C) 1000 MG tablet Take 1,000 mg by mouth once a week.    [provider]  aspirin EC 81 MG tablet Take 81 mg by mouth 3 (three) times a week.    [provider]  b complex vitamins capsule Take 1 capsule by mouth once a week.    [provider]  Cholecalciferol (VITAMIN D) 2000 units CAPS Take 2,000 Units by mouth daily.    [provider]  Coenzyme  Q10 (COQ10) 100 MG CAPS Take 100 mg by mouth once a week.    [provider]  Lactobacillus (ACIDOPHILUS PO) Take 1 capsule by mouth once a week.     [provider]  Magnesium 250 MG TABS Take 250 mg by mouth once a week.     [provider]  NON FORMULARY Take 750 mg by mouth once a week. CURAMED    [provider]  Omega-3 Fatty Acids (FISH OIL PO) Take 1,200 mg by mouth daily.     [provider]  OVER THE COUNTER MEDICATION Take 500 mg by mouth 2 (two) times daily. CHOLESTOFF    [provider]     Allergies:     Allergies  Allergen Reactions  . Flagyl [Metronidazole]      Physical Exam:   Vitals  Blood pressure (!) 141/67, pulse 74, temperature 98.2 F (36.8 C), temperature source Oral, resp. rate 18, SpO2 100 %.  1.  General: Appears in no acute distress  2. Psychiatric: Alert, oriented x3, intact insight and judgment  3. Neurologic: Cranial nerves II through XII grossly intact, no focal deficit noted  4. HEENMT:  Atraumatic normocephalic, extraocular muscles are intact  5. Respiratory : Clear to auscultation bilaterally, no wheezing or crackles auscultated  6.  Cardiovascular : S1-S2, regular, no murmur auscultated  7. Gastrointestinal:  Abdomen is soft, nontender, no organomegaly, no rigidity or guarding.  Bowel sounds are present throughout   8. Skin:  No rashes noted  9.Musculoskeletal:  No edema in the lower extremities    Data Review:    CBC Recent Labs  Lab 03/05/20 1433  WBC 17.7*  HGB 12.2  HCT 37.7  PLT 434*  MCV 96.2  MCH 31.1  MCHC 32.4  RDW 13.5  LYMPHSABS 1.9  MONOABS 1.6*  EOSABS 0.1  BASOSABS 0.1   ------------------------------------------------------------------------------------------------------------------  Results for orders placed or performed during the hospital encounter of 03/05/20 (from the past 48 hour(s))  CBC with Differential     Status: Abnormal   Collection Time: 03/05/20  2:33 PM  Result Value Ref Range   WBC 17.7 (H) 4.0 - 10.5 K/uL   RBC 3.92 3.87 - 5.11 MIL/uL   Hemoglobin 12.2 12.0 - 15.0 g/dL   HCT 37.7 36.0 - 46.0 %   MCV 96.2 80.0 - 100.0 fL   MCH 31.1 26.0 - 34.0 pg   MCHC 32.4 30.0 - 36.0 g/dL   RDW 13.5 11.5 - 15.5 %   Platelets 434 (H) 150 - 400 K/uL   nRBC 0.0 0.0 - 0.2 %   Neutrophils Relative % 79 %   Neutro Abs 14.0 (H) 1.7 - 7.7 K/uL   Lymphocytes Relative 11 %   Lymphs Abs 1.9 0.7 - 4.0 K/uL   Monocytes Relative 9 %   Monocytes Absolute 1.6 (H) 0.1 - 1.0 K/uL   Eosinophils Relative 1 %   Eosinophils Absolute 0.1 0.0 - 0.5 K/uL   Basophils Relative 0 %   Basophils Absolute 0.1 0.0 - 0.1 K/uL   Immature Granulocytes 0 %   Abs Immature Granulocytes 0.05 0.00 - 0.07 K/uL    Comment: Performed at [facility], [place] [address], [place], [place] [address]  Comprehensive metabolic panel     Status: Abnormal   Collection Time: 03/05/20  2:33 PM  Result Value Ref Range   Sodium 136 135 - 145 mmol/L   Potassium 3.4 (L) 3.5 - 5.1 mmol/L   Chloride 100  98 - 111 mmol/L   CO2 26 22 - 32 mmol/L   Glucose, Bld 104 (H) 70 - 99 mg/dL    Comment: Glucose  reference range applies only to samples taken after fasting for at least 8 hours.   BUN 10 8 - 23 mg/dL   Creatinine, Ser 0.58 0.44 - 1.00 mg/dL   Calcium 9.5 8.9 - 10.3 mg/dL   Total Protein 7.2 6.5 - 8.1 g/dL   Albumin 3.9 3.5 - 5.0 g/dL   AST 21 15 - 41 U/L   ALT 14 0 - 44 U/L   Alkaline Phosphatase 76 38 - 126 U/L   Total Bilirubin 0.6 0.3 - 1.2 mg/dL   GFR calc non Af Amer >60 >60 mL/min   GFR calc Af Amer >60 >60 mL/min   Anion gap 10 5 - 15    Comment: Performed at [facility], [place] [address] [place], [place] [address]    Chemistries  Recent Labs  Lab 03/05/20 1433  NA 136  K 3.4*  CL 100  CO2 26  GLUCOSE 104*  BUN 10  CREATININE 0.58  CALCIUM 9.5  AST 21  ALT 14  ALKPHOS 76  BILITOT 0.6   ------------------------------------------------------------------------------------------------------------------  ------------------------------------------------------------------------------------------------------------------ GFR: CrCl cannot be calculated (Unknown ideal weight.). Liver Function Tests: Recent Labs  Lab 03/05/20 1433  AST 21  ALT 14  ALKPHOS 76  BILITOT 0.6  PROT 7.2  ALBUMIN 3.9   No results for input(s): LIPASE, AMYLASE in the last 168 hours. No results for input(s): AMMONIA in the last 168 hours. Coagulation Profile: No results for input(s): INR, PROTIME in the last 168 hours. Cardiac Enzymes: No results for input(s): CKTOTAL, CKMB, CKMBINDEX, TROPONINI in the last 168 hours. BNP (last 3 results) No results for input(s): PROBNP in the last 8760 hours. HbA1C: No results for input(s): HGBA1C in the last 72 hours. CBG: No results for input(s): GLUCAP in the last 168 hours. Lipid Profile: No results for input(s): CHOL, HDL, LDLCALC, TRIG, CHOLHDL, LDLDIRECT in the last 72 hours. Thyroid Function Tests: No results for input(s): TSH, T4TOTAL, FREET4, T3FREE, THYROIDAB in the last 72 hours. Anemia Panel: No results for  input(s): VITAMINB12, FOLATE, FERRITIN, TIBC, IRON, RETICCTPCT in the last 72 hours.  --------------------------------------------------------------------------------------------------------------- Urine analysis: No results found for: COLORURINE, APPEARANCEUR, LABSPEC, PHURINE, GLUCOSEU, HGBUR, BILIRUBINUR, KETONESUR, PROTEINUR, UROBILINOGEN, NITRITE, LEUKOCYTESUR    Imaging Results:    CT ABDOMEN PELVIS W CONTRAST  Result Date: 03/05/2020 CLINICAL DATA:  Sigmoid colon region abscess EXAM: CT ABDOMEN AND PELVIS WITH CONTRAST TECHNIQUE: Multidetector CT imaging of the abdomen and pelvis was performed using the standard protocol following bolus administration of intravenous contrast. Oral contrast was also administered. CONTRAST:  137mL ISOVUE-300 IOPAMIDOL (ISOVUE-300) INJECTION 61% COMPARISON:  100 mL Isovue-300 nonionic FINDINGS: Lower chest: There is scarring in the posterior left base. No lung base edema or consolidation. There is mild coronary artery calcification. Hepatobiliary: No focal liver lesions are evident. The gallbladder wall is not appreciably thickened. There is no biliary duct dilatation. Pancreas: There is no pancreatic mass or inflammatory focus. Spleen: No splenic lesions are evident. Adrenals/Urinary Tract: Right adrenal appears normal. There is stable left adrenal hypertrophy. There is no evident renal mass on either side. There is an extrarenal pelvis on each side, an anatomic variant. There is moderate fullness of the left renal collecting system. There is no hydronephrosis on the right. No intrarenal calculus is seen on either side. There is no ureteral calculus. There  is abrupt change in the contour of the mid left ureter at the site of apparent sigmoid abscess, likely causing inflammation of the mid left ureter. Urinary bladder is midline with wall thickness within normal limits. Stomach/Bowel: There remains thickening of the mid sigmoid colon wall extending over a distance of  approximately 12 cm. This inflammation was present previously. In comparison with the previous study, there is now extensive air in a thick-walled collection just to the left of the sigmoid diverticulitis containing multiple foci of air measuring 5.2 x 4.2 cm. There is felt to be localized extraluminal air in this region consistent with microperforations. There is fluid along the more inferior, lateral aspect of this complex collection measuring 2.1 x 1.0 cm, best appreciated on coronal images. No other bowel inflammation evident. There is moderate stool in the colon. Rectum is mildly distended with stool. No free air beyond apparent sigmoid microperforations or portal venous air. The terminal ileal region appears unremarkable. Vascular/Lymphatic: There is no abdominal aortic aneurysm. There is aortic and iliac artery atherosclerosis. Major venous structures appear patent. There is no evident adenopathy in the abdomen or pelvis. Reproductive: Uterus is anteverted and canted toward the right. There are calcified leiomyomatous lesions within the uterus, stable. No extrauterine pelvic mass identified apart from the changes involving the sigmoid colon. Other: No periappendiceal region inflammatory change. No ascites evident in the abdomen or pelvis. Musculoskeletal: There is degenerative change in the lumbar spine. No blastic or lytic bone lesions. No intramuscular or abdominal wall lesions. IMPRESSION: 1. Sigmoid diverticulitis remains extending over a distance of approximately 12 cm in the mid sigmoid region. There is wall thickening in this area. Immediately to the left of the diverticulitis, there is evidence of a complex collection with air and fluid consistent with suspected combination of abscess and perforation. This area is not well defined at this time, and this area does not have a well-defined wall. Suspect developing recurrent abscess with associated microperforation. Note that an underlying colonic  neoplasm with inflammation has not been excluded with this study. 2. No evident abscess or perforation elsewhere. No other bowel inflammatory change. 3. Hydronephrosis on the left with inflammation at the level of the mid ureter due to the apparent sigmoid region inflammation/developing abscess. No renal or ureteral calculus evident on either side. 4.  Stable left adrenal hypertrophy. 5.  Leiomyomatous uterus. 6. Aortic Atherosclerosis (ICD10-I70.0). There are foci of coronary artery and iliac artery atherosclerosis. These results will be called to the ordering clinician or representative by the Radiologist Assistant, and communication documented in the PACS or [employer]. Electronically Signed   By: Lowella Grip III M.D.   On: 03/05/2020 12:06       Assessment & Plan:    Active Problems:   Colonic diverticular abscess   1. Diverticular abscess with perforation-CT scan shows sigmoid diverticulitis extending a distance of 12 cm in the mid sigmoid region with complex collection of fluid and air consistent with combination of abscess and perforation.  Gastroenterology was consulted, they recommend getting IR involved for drain placement and also surgical consultation.  ED provider is going to consult IR and surgery.  Patient has been started on IV Zosyn.  We will continue with IV Zosyn.  Keep her n.p.o.  IV normal saline at 100 mL/h. 2. We will hold oral supplements, which patient takes at home.    DVT Prophylaxis-   Lovenox   AM Labs Ordered, also please review Full Orders  Family Communication: Admission, patients condition  and plan of care including tests being ordered have been discussed with the patient  who indicate understanding and agree with the plan and Code Status.  Code Status: Full code  Admission status: Observation/Inpatient :The appropriate admission status for this patient is INPATIENT. Inpatient status is judged to be reasonable and necessary in order to provide the  required intensity of service to ensure the patient's safety. The patient's presenting symptoms, physical exam findings, and initial radiographic and laboratory data in the context of their chronic comorbidities is felt to place them at high risk for further clinical deterioration. Furthermore, it is not anticipated that the patient will be medically stable for discharge from the hospital within 2 midnights of admission. The following factors support the admission status of inpatient.     The patient's presenting symptoms include abdominal pain. The worrisome physical exam findings include none. The initial radiographic and laboratory data are worrisome because of diverticular abscess with perforation. The chronic co-morbidities include hyperlipidemia.       * I certify that at the point of admission it is my clinical judgment that the patient will require inpatient hospital care spanning beyond 2 midnights from the point of admission due to high intensity of service, high risk for further deterioration and high frequency of surveillance required.*  Time spent in minutes : 60 minutes   Ridhaan Dreibelbis S Ann-Marie Kluge M.D

## 2020-03-05 NOTE — Progress Notes (Signed)
Pharmacy Antibiotic Note  Olivia Gomez is a 75 y.o. female admitted on 03/05/2020 with intermittent abd cramping since March.  Pharmacy has been consulted for zosyn dosing.  Plan: Zosyn 3.375g IV Q8H infused over 4hrs. F/u renal function and clinical course      Temp (24hrs), Avg:98.2 F (36.8 C), Min:98.2 F (36.8 C), Max:98.2 F (36.8 C)  Recent Labs  Lab 03/05/20 1433  WBC 17.7*  CREATININE 0.58    CrCl cannot be calculated (Unknown ideal weight.).    Allergies  Allergen Reactions  . Flagyl [Metronidazole]      Thank you for allowing pharmacy to be a part of this patient's care.  Dolly Rias RPh 03/05/2020, 5:52 PM

## 2020-03-05 NOTE — ED Triage Notes (Signed)
Pt had abd Ct scan done today which showed abscess and perforation and was advised to go to ED.

## 2020-03-05 NOTE — Consult Note (Addendum)
Reason for Consult: Abnormal CT scan increased white count Referring Physician: ER physician  Olivia Gomez is an 74 y.o. female.  HPI: Patient seen and examined and case discussed with her primary gastroenterologist Dr. Therisa Doyne in her hospital computer chart reviewed as well as our office computer chart and she did see 1 surgeon as an outpatient but prefers to see a different one here at the hospital and she really has not had much pain and she has not seen any blood or had fever or lost any weight but she does tend to have constipation and has been on antibiotics for over 3 weeks but is allergic to Flagyl and it sounds like she has been on Augmentin and 1 grandparent had colon cancer but no other relatives and has no other complaints and her previous colonoscopy was almost 5 years ago and she is due in the fall for repeat screening for her history of polyps  Past Medical History:  Diagnosis Date  . Elevated cholesterol   . Emphysema lung ([place])   . Lump in chest   . Tobacco user   . Vitamin D deficiency     History reviewed. No pertinent surgical history.  Family History  Problem Relation Age of Onset  . Dementia Mother   . Heart failure Father   . Canavan disease Maternal Grandmother        BREAST  . Cancer Paternal Grandmother        INTESTINAL    Social History:  reports that she has been smoking cigarettes. She has a 52.00 pack-year smoking history. She has never used smokeless tobacco. No history on file for alcohol and drug.  Allergies:  Allergies  Allergen Reactions  . Flagyl [Metronidazole]     Medications: I have reviewed the patient's current medications.  Results for orders placed or performed during the hospital encounter of 03/05/20 (from the past 48 hour(s))  CBC with Differential     Status: Abnormal   Collection Time: 03/05/20  2:33 PM  Result Value Ref Range   WBC 17.7 (H) 4.0 - 10.5 K/uL   RBC 3.92 3.87 - 5.11 MIL/uL   Hemoglobin 12.2 12.0 - 15.0 g/dL   HCT 37.7 36.0 - 46.0 %   MCV 96.2 80.0 - 100.0 fL   MCH 31.1 26.0 - 34.0 pg   MCHC 32.4 30.0 - 36.0 g/dL   RDW 13.5 11.5 - 15.5 %   Platelets 434 (H) 150 - 400 K/uL   nRBC 0.0 0.0 - 0.2 %   Neutrophils Relative % 79 %   Neutro Abs 14.0 (H) 1.7 - 7.7 K/uL   Lymphocytes Relative 11 %   Lymphs Abs 1.9 0.7 - 4.0 K/uL   Monocytes Relative 9 %   Monocytes Absolute 1.6 (H) 0.1 - 1.0 K/uL   Eosinophils Relative 1 %   Eosinophils Absolute 0.1 0.0 - 0.5 K/uL   Basophils Relative 0 %   Basophils Absolute 0.1 0.0 - 0.1 K/uL   Immature Granulocytes 0 %   Abs Immature Granulocytes 0.05 0.00 - 0.07 K/uL    Comment: Performed at [facility], [place] [address], [place], [place] [address]  Comprehensive metabolic panel     Status: Abnormal   Collection Time: 03/05/20  2:33 PM  Result Value Ref Range   Sodium 136 135 - 145 mmol/L   Potassium 3.4 (L) 3.5 - 5.1 mmol/L   Chloride 100 98 - 111 mmol/L   CO2 26 22 - 32 mmol/L  Glucose, Bld 104 (H) 70 - 99 mg/dL    Comment: Glucose reference range applies only to samples taken after fasting for at least 8 hours.   BUN 10 8 - 23 mg/dL   Creatinine, Ser 0.58 0.44 - 1.00 mg/dL   Calcium 9.5 8.9 - 10.3 mg/dL   Total Protein 7.2 6.5 - 8.1 g/dL   Albumin 3.9 3.5 - 5.0 g/dL   AST 21 15 - 41 U/L   ALT 14 0 - 44 U/L   Alkaline Phosphatase 76 38 - 126 U/L   Total Bilirubin 0.6 0.3 - 1.2 mg/dL   GFR calc non Af Amer >60 >60 mL/min   GFR calc Af Amer >60 >60 mL/min   Anion gap 10 5 - 15    Comment: Performed at [facility], [place] [address] [place], [place] [address]    CT ABDOMEN PELVIS W CONTRAST  Result Date: 03/05/2020 CLINICAL DATA:  Sigmoid colon region abscess EXAM: CT ABDOMEN AND PELVIS WITH CONTRAST TECHNIQUE: Multidetector CT imaging of the abdomen and pelvis was performed using the standard protocol following bolus administration of intravenous contrast. Oral contrast was also administered. CONTRAST:  168mL  ISOVUE-300 IOPAMIDOL (ISOVUE-300) INJECTION 61% COMPARISON:  100 mL Isovue-300 nonionic FINDINGS: Lower chest: There is scarring in the posterior left base. No lung base edema or consolidation. There is mild coronary artery calcification. Hepatobiliary: No focal liver lesions are evident. The gallbladder wall is not appreciably thickened. There is no biliary duct dilatation. Pancreas: There is no pancreatic mass or inflammatory focus. Spleen: No splenic lesions are evident. Adrenals/Urinary Tract: Right adrenal appears normal. There is stable left adrenal hypertrophy. There is no evident renal mass on either side. There is an extrarenal pelvis on each side, an anatomic variant. There is moderate fullness of the left renal collecting system. There is no hydronephrosis on the right. No intrarenal calculus is seen on either side. There is no ureteral calculus. There is abrupt change in the contour of the mid left ureter at the site of apparent sigmoid abscess, likely causing inflammation of the mid left ureter. Urinary bladder is midline with wall thickness within normal limits. Stomach/Bowel: There remains thickening of the mid sigmoid colon wall extending over a distance of approximately 12 cm. This inflammation was present previously. In comparison with the previous study, there is now extensive air in a thick-walled collection just to the left of the sigmoid diverticulitis containing multiple foci of air measuring 5.2 x 4.2 cm. There is felt to be localized extraluminal air in this region consistent with microperforations. There is fluid along the more inferior, lateral aspect of this complex collection measuring 2.1 x 1.0 cm, best appreciated on coronal images. No other bowel inflammation evident. There is moderate stool in the colon. Rectum is mildly distended with stool. No free air beyond apparent sigmoid microperforations or portal venous air. The terminal ileal region appears unremarkable. Vascular/Lymphatic:  There is no abdominal aortic aneurysm. There is aortic and iliac artery atherosclerosis. Major venous structures appear patent. There is no evident adenopathy in the abdomen or pelvis. Reproductive: Uterus is anteverted and canted toward the right. There are calcified leiomyomatous lesions within the uterus, stable. No extrauterine pelvic mass identified apart from the changes involving the sigmoid colon. Other: No periappendiceal region inflammatory change. No ascites evident in the abdomen or pelvis. Musculoskeletal: There is degenerative change in the lumbar spine. No blastic or lytic bone lesions. No intramuscular or abdominal wall lesions. IMPRESSION: 1. Sigmoid diverticulitis remains  extending over a distance of approximately 12 cm in the mid sigmoid region. There is wall thickening in this area. Immediately to the left of the diverticulitis, there is evidence of a complex collection with air and fluid consistent with suspected combination of abscess and perforation. This area is not well defined at this time, and this area does not have a well-defined wall. Suspect developing recurrent abscess with associated microperforation. Note that an underlying colonic neoplasm with inflammation has not been excluded with this study. 2. No evident abscess or perforation elsewhere. No other bowel inflammatory change. 3. Hydronephrosis on the left with inflammation at the level of the mid ureter due to the apparent sigmoid region inflammation/developing abscess. No renal or ureteral calculus evident on either side. 4.  Stable left adrenal hypertrophy. 5.  Leiomyomatous uterus. 6. Aortic Atherosclerosis (ICD10-I70.0). There are foci of coronary artery and iliac artery atherosclerosis. These results will be called to the ordering clinician or representative by the Radiologist Assistant, and communication documented in the PACS or [employer]. Electronically Signed   By: Lowella Grip III M.D.   On: 03/05/2020 12:06     Review of Systems negative as above and specifically she denies any urinary problems air in her urine etc. and has no problems eating Blood pressure (!) 141/67, pulse 75, temperature 98.2 F (36.8 C), temperature source Oral, resp. rate 18, SpO2 99 %. Physical Exam vital signs stable afebrile no acute distress looks much better than CT suggests in good spirits lungs are clear heart regular rate and rhythm abdomen is soft nontender occasional bowel sounds no pedal edema good peripheral pulses chemistries okay White count 17 CT is reviewed  Assessment/Plan: Probable difficult to treat diverticulitis with abscess Plan: We will allow clear liquids and recommend IV antibiotics and since she is allergic to Flagyl might require a combination like Cipro and clindamycin or imipenem or Zosyn in combination as well but will leave to hospital team and would recommend surgical consultation as well as interventional radiology to consider drain and we will check on tomorrow  [facility] E 03/05/2020, 4:38 PM

## 2020-03-05 NOTE — ED Provider Notes (Signed)
[facility] DEPT Provider Note   CSN: [id number] Arrival date & time: 03/05/20  1402     History No chief complaint on file.   Olivia Gomez is a 74 y.o. female.   Presents ER with abnormal CT scan.  Patient reports since late March she has been having intermittent lower abdominal cramping.  Followed by [facility], CT scan demonstrating diverticulitis/colitis, possible abscess formation.  Completed course of Augmentin, repeat CT was improving.  CT scan performed today for follow-up now concerning for worsening perforation with abscess formation.  Symptomatically, patient states that she occasionally has some mild lower abdominal cramping, no nausea, vomiting, fevers.  Currently does not have any pain.        Past Medical History:  Diagnosis Date  . Elevated cholesterol   . Emphysema lung ([place])   . Lump in chest   . Tobacco user   . Vitamin D deficiency     Patient Active Problem List   Diagnosis Date Noted  . Colonic diverticular abscess 03/05/2020    History reviewed. No pertinent surgical history.   OB History   No obstetric history on file.     Family History  Problem Relation Age of Onset  . Dementia Mother   . Heart failure Father   . Canavan disease Maternal Grandmother        BREAST  . Cancer Paternal Grandmother        INTESTINAL    Social History   Tobacco Use  . Smoking status: Current Every Day Smoker    Packs/day: 1.00    Years: 52.00    Pack years: 52.00    Types: Cigarettes  . Smokeless tobacco: Never Used  Substance Use Topics  . Alcohol use: Not on file  . Drug use: Not on file    Home Medications Prior to Admission medications   Medication Sig Start Date End Date Taking? Authorizing Provider  Ascorbic Acid (VITAMIN C) 1000 MG tablet Take 1,000 mg by mouth once a week.   Yes [provider]  aspirin EC 81 MG tablet Take 81 mg by mouth daily.    Yes [provider]  b complex vitamins  capsule Take 1 capsule by mouth daily.    Yes [provider]  Cholecalciferol (VITAMIN D) 2000 units CAPS Take 2,000 Units by mouth daily.   Yes [provider]  Cod Liver Oil CAPS Take 1 capsule by mouth daily.   Yes [provider]  Coenzyme Q10 (COQ10) 100 MG CAPS Take 100 mg by mouth once a week.   Yes [provider]  Lactobacillus (ACIDOPHILUS PO) Take 1 capsule by mouth once a week.    Yes [provider]  Magnesium 250 MG TABS Take 250 mg by mouth once a week.    Yes [provider]  Omega-3 Fatty Acids (FISH OIL PO) Take 1,200 mg by mouth daily.    Yes [provider]    Allergies    Flagyl [metronidazole]  Review of Systems   Review of Systems  Constitutional: Negative for chills and fever.  HENT: Negative for ear pain and sore throat.   Eyes: Negative for pain and visual disturbance.  Respiratory: Negative for cough and shortness of breath.   Cardiovascular: Negative for chest pain and palpitations.  Gastrointestinal: Positive for abdominal pain. Negative for vomiting.  Genitourinary: Negative for dysuria and hematuria.  Musculoskeletal: Negative for arthralgias and back pain.  Skin: Negative for color change and rash.  Neurological: Negative for seizures and syncope.  All other systems reviewed and are negative.   Physical Exam Updated Vital Signs BP (!) 141/67   Pulse 80   Temp 98.2 F (36.8 C) (Oral)   Resp 18   SpO2 99%   Physical Exam Vitals and nursing note reviewed.  Constitutional:      General: She is not in acute distress.    Appearance: She is well-developed.  HENT:     Head: Normocephalic and atraumatic.  Eyes:     Conjunctiva/sclera: Conjunctivae normal.  Cardiovascular:     Rate and Rhythm: Normal rate and regular rhythm.     Heart sounds: No murmur.  Pulmonary:     Effort: Pulmonary effort is normal. No respiratory distress.     Breath sounds: Normal breath sounds.  Abdominal:       Palpations: Abdomen is soft.     Tenderness: There is no abdominal tenderness.     Comments: No focal tenderness to palpation throughout her exam  Musculoskeletal:     Cervical back: Neck supple.  Skin:    General: Skin is warm and dry.  Neurological:     Mental Status: She is alert.     ED Results / Procedures / Treatments   Labs (all labs ordered are listed, but only abnormal results are displayed) Labs Reviewed  CBC WITH DIFFERENTIAL/PLATELET - Abnormal; Notable for the following components:      Result Value   WBC 17.7 (*)    Platelets 434 (*)    Neutro Abs 14.0 (*)    Monocytes Absolute 1.6 (*)    All other components within normal limits  COMPREHENSIVE METABOLIC PANEL - Abnormal; Notable for the following components:   Potassium 3.4 (*)    Glucose, Bld 104 (*)    All other components within normal limits  SARS CORONAVIRUS 2 BY RT PCR (HOSPITAL ORDER, [facility])    EKG None  Radiology CT ABDOMEN PELVIS W CONTRAST  Result Date: 03/05/2020 CLINICAL DATA:  Sigmoid colon region abscess EXAM: CT ABDOMEN AND PELVIS WITH CONTRAST TECHNIQUE: Multidetector CT imaging of the abdomen and pelvis was performed using the standard protocol following bolus administration of intravenous contrast. Oral contrast was also administered. CONTRAST:  110mL ISOVUE-300 IOPAMIDOL (ISOVUE-300) INJECTION 61% COMPARISON:  100 mL Isovue-300 nonionic FINDINGS: Lower chest: There is scarring in the posterior left base. No lung base edema or consolidation. There is mild coronary artery calcification. Hepatobiliary: No focal liver lesions are evident. The gallbladder wall is not appreciably thickened. There is no biliary duct dilatation. Pancreas: There is no pancreatic mass or inflammatory focus. Spleen: No splenic lesions are evident. Adrenals/Urinary Tract: Right adrenal appears normal. There is stable left adrenal hypertrophy. There is no evident renal mass on either side.  There is an extrarenal pelvis on each side, an anatomic variant. There is moderate fullness of the left renal collecting system. There is no hydronephrosis on the right. No intrarenal calculus is seen on either side. There is no ureteral calculus. There is abrupt change in the contour of the mid left ureter at the site of apparent sigmoid abscess, likely causing inflammation of the mid left ureter. Urinary bladder is midline with wall thickness within normal limits. Stomach/Bowel: There remains thickening of the mid sigmoid colon wall extending over a distance of approximately 12 cm. This inflammation was present previously. In comparison with the previous study, there is now extensive air in a thick-walled collection just to the left  of the sigmoid diverticulitis containing multiple foci of air measuring 5.2 x 4.2 cm. There is felt to be localized extraluminal air in this region consistent with microperforations. There is fluid along the more inferior, lateral aspect of this complex collection measuring 2.1 x 1.0 cm, best appreciated on coronal images. No other bowel inflammation evident. There is moderate stool in the colon. Rectum is mildly distended with stool. No free air beyond apparent sigmoid microperforations or portal venous air. The terminal ileal region appears unremarkable. Vascular/Lymphatic: There is no abdominal aortic aneurysm. There is aortic and iliac artery atherosclerosis. Major venous structures appear patent. There is no evident adenopathy in the abdomen or pelvis. Reproductive: Uterus is anteverted and canted toward the right. There are calcified leiomyomatous lesions within the uterus, stable. No extrauterine pelvic mass identified apart from the changes involving the sigmoid colon. Other: No periappendiceal region inflammatory change. No ascites evident in the abdomen or pelvis. Musculoskeletal: There is degenerative change in the lumbar spine. No blastic or lytic bone lesions. No  intramuscular or abdominal wall lesions. IMPRESSION: 1. Sigmoid diverticulitis remains extending over a distance of approximately 12 cm in the mid sigmoid region. There is wall thickening in this area. Immediately to the left of the diverticulitis, there is evidence of a complex collection with air and fluid consistent with suspected combination of abscess and perforation. This area is not well defined at this time, and this area does not have a well-defined wall. Suspect developing recurrent abscess with associated microperforation. Note that an underlying colonic neoplasm with inflammation has not been excluded with this study. 2. No evident abscess or perforation elsewhere. No other bowel inflammatory change. 3. Hydronephrosis on the left with inflammation at the level of the mid ureter due to the apparent sigmoid region inflammation/developing abscess. No renal or ureteral calculus evident on either side. 4.  Stable left adrenal hypertrophy. 5.  Leiomyomatous uterus. 6. Aortic Atherosclerosis (ICD10-I70.0). There are foci of coronary artery and iliac artery atherosclerosis. These results will be called to the ordering clinician or representative by the Radiologist Assistant, and communication documented in the PACS or [employer]. Electronically Signed   By: Lowella Grip III M.D.   On: 03/05/2020 12:06    Procedures Procedures (including critical care time)  Medications Ordered in ED Medications  0.9 %  sodium chloride infusion (has no administration in time range)  acetaminophen (TYLENOL) tablet 650 mg (has no administration in time range)    Or  acetaminophen (TYLENOL) suppository 650 mg (has no administration in time range)  ondansetron (ZOFRAN) tablet 4 mg (has no administration in time range)    Or  ondansetron (ZOFRAN) injection 4 mg (has no administration in time range)  potassium chloride 10 mEq in 100 mL IVPB (has no administration in time range)  piperacillin-tazobactam  (ZOSYN) IVPB 3.375 g (has no administration in time range)  piperacillin-tazobactam (ZOSYN) IVPB 3.375 g (0 g Intravenous Stopped 03/05/20 1744)    ED Course  I have reviewed the triage vital signs and the nursing notes.  Pertinent labs & imaging results that were available during my care of the patient were reviewed by me and considered in my medical decision making (see chart for details).  Clinical Course as of Mar 05 1840  Mon Mar 05, 2020  1601 Completed initial assessment, will d/w Howie Ill   [RD]    Clinical Course User Index [RD] Lucrezia Starch, MD   MDM Rules/Calculators/A&P  74 year old lady presenting to ER with concern for abnormal CT.  Patient has been treated for sigmoid diverticulitis complicated by abscess formation back in April, initially repeat CT was improving however repeat CT today showed worsening of the likely abscess.  Dr. Watt Climes evaluated patient, recommended IV antibiotics and admission to the hospitalist service.  Additionally recommended consultation with IR and general surgery.  I consulted interventional radiology and general surgery.  IR will have someone evaluate tomorrow morning, likely attempt drainage tomorrow.  General surgery will have someone see patient tomorrow morning in consultation, likely no acute intervention though patient may benefit later down the road from a sigmoidectomy.  Started pt on Zosyn.  Dr. Darrick Meigs with hospitalist accepting.   Final Clinical Impression(s) / ED Diagnoses Final diagnoses:  Diverticulitis  Abscess  Leukocytosis, unspecified type    Rx / DC Orders ED Discharge Orders    None       Lucrezia Starch, MD 03/05/20 [phone]

## 2020-03-06 ENCOUNTER — Encounter (HOSPITAL_COMMUNITY): Payer: Self-pay | Admitting: Family Medicine

## 2020-03-06 ENCOUNTER — Other Ambulatory Visit: Payer: Self-pay

## 2020-03-06 DIAGNOSIS — D72829 Elevated white blood cell count, unspecified: Secondary | ICD-10-CM

## 2020-03-06 DIAGNOSIS — L0291 Cutaneous abscess, unspecified: Secondary | ICD-10-CM

## 2020-03-06 LAB — COMPREHENSIVE METABOLIC PANEL
ALT: 10 U/L (ref 0–44)
AST: 14 U/L — ABNORMAL LOW (ref 15–41)
Albumin: 2.7 g/dL — ABNORMAL LOW (ref 3.5–5.0)
Alkaline Phosphatase: 60 U/L (ref 38–126)
Anion gap: 10 (ref 5–15)
BUN: 6 mg/dL — ABNORMAL LOW (ref 8–23)
CO2: 24 mmol/L (ref 22–32)
Calcium: 8.5 mg/dL — ABNORMAL LOW (ref 8.9–10.3)
Chloride: 106 mmol/L (ref 98–111)
Creatinine, Ser: 0.56 mg/dL (ref 0.44–1.00)
GFR calc Af Amer: >60 mL/min (ref >60)
GFR calc non Af Amer: >60 mL/min (ref >60)
Glucose, Bld: 93 mg/dL (ref 70–99)
Potassium: 3.5 mmol/L (ref 3.5–5.1)
Sodium: 140 mmol/L (ref 135–145)
Total Bilirubin: 0.7 mg/dL (ref 0.3–1.2)
Total Protein: 5.5 g/dL — ABNORMAL LOW (ref 6.5–8.1)

## 2020-03-06 LAB — PROTIME-INR
INR: 1 (ref 0.8–1.2)
Prothrombin Time: 13.1 seconds (ref 11.4–15.2)

## 2020-03-06 LAB — CBC
HCT: 32.6 % — ABNORMAL LOW (ref 36.0–46.0)
Hemoglobin: 10.6 g/dL — ABNORMAL LOW (ref 12.0–15.0)
MCH: 30.9 pg (ref 26.0–34.0)
MCHC: 32.5 g/dL (ref 30.0–36.0)
MCV: 95 fL (ref 80.0–100.0)
Platelets: 358 10*3/uL (ref 150–400)
RBC: 3.43 MIL/uL — ABNORMAL LOW (ref 3.87–5.11)
RDW: 13.5 % (ref 11.5–15.5)
WBC: 13.4 10*3/uL — ABNORMAL HIGH (ref 4.0–10.5)
nRBC: 0 % (ref 0.0–0.2)

## 2020-03-06 MED ORDER — BISACODYL 10 MG RE SUPP
10.0000 mg | Freq: Once | RECTAL | Status: AC
  Administered 2020-03-06: 10 mg via RECTAL
  Filled 2020-03-06: qty 1

## 2020-03-06 NOTE — Progress Notes (Signed)
Tollie Pizza 3:11 PM  Subjective: Patient feeling fine wants to go home no new complaints we answered all of her questions  Objective: Vital signs stable afebrile no acute distress abdomen is soft nontender white count decreased a little other labs okay  Assessment: Difficult to treat diverticulitis  Plan: Agree with clear liquid diet and if okay soft solids tomorrow as per surgery team and would use a different combination of antibiotics as an outpatient other than Augmentin and follow-up with Dr. Therisa Doyne in a few weeks to discuss the timing of colonoscopy  [facility]  office [phone] After 5PM or if no answer call [phone]

## 2020-03-06 NOTE — Consult Note (Signed)
Olivia Gomez [birth year]  [id number].    Requesting MD: Dr. Eleonore Chiquito Chief Complaint/Reason for Consult: diverticulitis with contained microperforation  HPI:  This is a 74 yo white female with a documented history of emphysema secondary to tobacco abuse who has been followed by Dr. Therisa Gomez in the GI office as she began having abdominal symptoms in March.  The patient normally has a journal for which she writes of all this information.  She does not currently have this with her and is unable to provide exact details.  It sounds like she was diagnosed via CT scan with diverticulitis in April after symptoms started in March of abdominal pain.  She was treated with Augmentin.  A follow up CT scan was completed several weeks later with improvement.  She was referred to our office and seen by Dr. Dema Gomez.  He recommended a colonoscopy.  She is currently due for one as her last was 5 yrs ago and she has a personal history of polyps in the past.  She has not been having any further abdominal pain, nausea, food intolerance, or blood stools.  She was sent for another follow up CT scan, for unclear reasons to me at this time.  This revealed sigmoid diverticulitis over 12cm of the sigmoid colon with a complex collection of air and some fluid c/w a contained perforation.  Her WBC is mildly increased today to 13K.  Her other labs are normal.  She is currently asymptomatic.  We have been asked to see her for further recommendations.  ROS: ROS: Please see HPI, otherwise all other systems have been reviewed and are negative  Family History  Problem Relation Age of Onset  . Dementia Mother   . Heart failure Father   . Canavan disease Maternal Grandmother        BREAST  . Cancer Paternal Grandmother        INTESTINAL    Past Medical History:  Diagnosis Date  . Elevated cholesterol   . Emphysema lung ([place])   . Lump in chest   . Tobacco user   . Vitamin D deficiency     History reviewed. No pertinent  surgical history.  Social History:  reports that she has been smoking cigarettes. She has a 52.00 pack-year smoking history. She has never used smokeless tobacco. No history on file for alcohol and drug.  Allergies:  Allergies  Allergen Reactions  . Flagyl [Metronidazole] Hives    Medications Prior to Admission  Medication Sig Dispense Refill  . Ascorbic Acid (VITAMIN C) 1000 MG tablet Take 1,000 mg by mouth once a week.    Marland Kitchen aspirin EC 81 MG tablet Take 81 mg by mouth daily.     Marland Kitchen b complex vitamins capsule Take 1 capsule by mouth daily.     . Cholecalciferol (VITAMIN D) 2000 units CAPS Take 2,000 Units by mouth daily.    Marland Kitchen Cod Liver Oil CAPS Take 1 capsule by mouth daily.    . Coenzyme Q10 (COQ10) 100 MG CAPS Take 100 mg by mouth once a week.    . Lactobacillus (ACIDOPHILUS PO) Take 1 capsule by mouth once a week.     . Magnesium 250 MG TABS Take 250 mg by mouth once a week.     . Omega-3 Fatty Acids (FISH OIL PO) Take 1,200 mg by mouth daily.        Physical Exam: Blood pressure 129/67, pulse (!) 57, temperature 98 F (36.7 C), temperature source Oral,  resp. rate 16, SpO2 97 %. General: pleasant, WD, WN white female who is laying in bed in NAD HEENT: head is normocephalic, atraumatic.  Sclera are noninjected.  PERRL.  Ears and nose without any masses or lesions.  Mouth is pink and moist Heart: regular, rate, and rhythm.  Normal s1,s2. No obvious murmurs, gallops, or rubs noted.  Palpable radial and pedal pulses bilaterally Lungs: CTAB, no wheezes, rhonchi, or rales noted.  Respiratory effort nonlabored Abd: soft, NT, ND, +BS, no masses, hernias, or organomegaly MS: all 4 extremities are symmetrical with no cyanosis, clubbing, or edema. Skin: warm and dry with no masses, lesions, or rashes Neuro: Cranial nerves 2-12 grossly intact, sensation is normal throughout Psych: A&Ox3 with an appropriate affect.   Results for orders placed or performed during the hospital encounter of  03/05/20 (from the past 48 hour(s))  CBC with Differential     Status: Abnormal   Collection Time: 03/05/20  2:33 PM  Result Value Ref Range   WBC 17.7 (H) 4.0 - 10.5 K/uL   RBC 3.92 3.87 - 5.11 MIL/uL   Hemoglobin 12.2 12.0 - 15.0 g/dL   HCT 37.7 36.0 - 46.0 %   MCV 96.2 80.0 - 100.0 fL   MCH 31.1 26.0 - 34.0 pg   MCHC 32.4 30.0 - 36.0 g/dL   RDW 13.5 11.5 - 15.5 %   Platelets 434 (H) 150 - 400 K/uL   nRBC 0.0 0.0 - 0.2 %   Neutrophils Relative % 79 %   Neutro Abs 14.0 (H) 1.7 - 7.7 K/uL   Lymphocytes Relative 11 %   Lymphs Abs 1.9 0.7 - 4.0 K/uL   Monocytes Relative 9 %   Monocytes Absolute 1.6 (H) 0.1 - 1.0 K/uL   Eosinophils Relative 1 %   Eosinophils Absolute 0.1 0.0 - 0.5 K/uL   Basophils Relative 0 %   Basophils Absolute 0.1 0.0 - 0.1 K/uL   Immature Granulocytes 0 %   Abs Immature Granulocytes 0.05 0.00 - 0.07 K/uL    Comment: Performed at [facility], [place] [address] [place], [place] [address]  Comprehensive metabolic panel     Status: Abnormal   Collection Time: 03/05/20  2:33 PM  Result Value Ref Range   Sodium 136 135 - 145 mmol/L   Potassium 3.4 (L) 3.5 - 5.1 mmol/L   Chloride 100 98 - 111 mmol/L   CO2 26 22 - 32 mmol/L   Glucose, Bld 104 (H) 70 - 99 mg/dL    Comment: Glucose reference range applies only to samples taken after fasting for at least 8 hours.   BUN 10 8 - 23 mg/dL   Creatinine, Ser 0.58 0.44 - 1.00 mg/dL   Calcium 9.5 8.9 - 10.3 mg/dL   Total Protein 7.2 6.5 - 8.1 g/dL   Albumin 3.9 3.5 - 5.0 g/dL   AST 21 15 - 41 U/L   ALT 14 0 - 44 U/L   Alkaline Phosphatase 76 38 - 126 U/L   Total Bilirubin 0.6 0.3 - 1.2 mg/dL   GFR calc non Af Amer >60 >60 mL/min   GFR calc Af Amer >60 >60 mL/min   Anion gap 10 5 - 15    Comment: Performed at [facility], [place] [address] [place], [place] [address]  SARS Coronavirus 2 by RT PCR (hospital order, performed in [facility] hospital lab) Nasopharyngeal Nasopharyngeal  Swab     Status: None   Collection Time: 03/05/20  5:17  PM   Specimen: Nasopharyngeal Swab  Result Value Ref Range   SARS Coronavirus 2 NEGATIVE NEGATIVE    Comment: (NOTE) SARS-CoV-2 target nucleic acids are NOT DETECTED. The SARS-CoV-2 RNA is generally detectable in upper and lower respiratory specimens during the acute phase of infection. The lowest concentration of SARS-CoV-2 viral copies this assay can detect is 250 copies / mL. A negative result does not preclude SARS-CoV-2 infection and should not be used as the sole basis for treatment or other patient management decisions.  A negative result may occur with improper specimen collection / handling, submission of specimen other than nasopharyngeal swab, presence of viral mutation(s) within the areas targeted by this assay, and inadequate number of viral copies (<250 copies / mL). A negative result must be combined with clinical observations, patient history, and epidemiological information. Fact Sheet for Patients:   StrictlyIdeas.no Fact Sheet for Healthcare Providers: BankingDealers.co.za This test is not yet approved or cleared  by the [place] FDA and has been authorized for detection and/or diagnosis of SARS-CoV-2 by FDA under an Emergency Use Authorization (EUA).  This EUA will remain in effect (meaning this test can be used) for the duration of the COVID-19 declaration under Section 564(b)(1) of the Act, 21 U.S.C. section 360bbb-3(b)(1), unless the authorization is terminated or revoked sooner. Performed at [facility], [place] [address], [place], [place] [address]   CBC     Status: Abnormal   Collection Time: 03/05/20  9:09 PM  Result Value Ref Range   WBC 18.3 (H) 4.0 - 10.5 K/uL   RBC 3.66 (L) 3.87 - 5.11 MIL/uL   Hemoglobin 11.3 (L) 12.0 - 15.0 g/dL   HCT 35.0 (L) 36.0 - 46.0 %   MCV 95.6 80.0 - 100.0 fL   MCH 30.9 26.0 - 34.0 pg   MCHC 32.3  30.0 - 36.0 g/dL   RDW 13.4 11.5 - 15.5 %   Platelets 404 (H) 150 - 400 K/uL   nRBC 0.0 0.0 - 0.2 %    Comment: Performed at [facility], [place] [address], [place], [place] [address]  Creatinine, serum     Status: None   Collection Time: 03/05/20  9:09 PM  Result Value Ref Range   Creatinine, Ser 0.65 0.44 - 1.00 mg/dL   GFR calc non Af Amer >60 >60 mL/min   GFR calc Af Amer >60 >60 mL/min    Comment: Performed at [facility], [place] [address], [place], [place] [address]  CBC     Status: Abnormal   Collection Time: 03/06/20  4:40 AM  Result Value Ref Range   WBC 13.4 (H) 4.0 - 10.5 K/uL   RBC 3.43 (L) 3.87 - 5.11 MIL/uL   Hemoglobin 10.6 (L) 12.0 - 15.0 g/dL   HCT 32.6 (L) 36.0 - 46.0 %   MCV 95.0 80.0 - 100.0 fL   MCH 30.9 26.0 - 34.0 pg   MCHC 32.5 30.0 - 36.0 g/dL   RDW 13.5 11.5 - 15.5 %   Platelets 358 150 - 400 K/uL   nRBC 0.0 0.0 - 0.2 %    Comment: Performed at [facility], [place] [address] [place], [place] [address]  Comprehensive metabolic panel     Status: Abnormal   Collection Time: 03/06/20  4:40 AM  Result Value Ref Range   Sodium 140 135 - 145 mmol/L   Potassium 3.5 3.5 - 5.1 mmol/L   Chloride 106 98 - 111 mmol/L  CO2 24 22 - 32 mmol/L   Glucose, Bld 93 70 - 99 mg/dL    Comment: Glucose reference range applies only to samples taken after fasting for at least 8 hours.   BUN 6 (L) 8 - 23 mg/dL   Creatinine, Ser 0.56 0.44 - 1.00 mg/dL   Calcium 8.5 (L) 8.9 - 10.3 mg/dL   Total Protein 5.5 (L) 6.5 - 8.1 g/dL   Albumin 2.7 (L) 3.5 - 5.0 g/dL   AST 14 (L) 15 - 41 U/L   ALT 10 0 - 44 U/L   Alkaline Phosphatase 60 38 - 126 U/L   Total Bilirubin 0.7 0.3 - 1.2 mg/dL   GFR calc non Af Amer >60 >60 mL/min   GFR calc Af Amer >60 >60 mL/min   Anion gap 10 5 - 15    Comment: Performed at [facility], [place] [address] [place], [place] [address]  Protime-INR     Status: None   Collection Time:  03/06/20  9:07 AM  Result Value Ref Range   Prothrombin Time 13.1 11.4 - 15.2 seconds   INR 1.0 0.8 - 1.2    Comment: (NOTE) INR goal varies based on device and disease states. Performed at [facility], [place] [address], [place], [place] [address]    CT ABDOMEN PELVIS W CONTRAST  Result Date: 03/05/2020 CLINICAL DATA:  Sigmoid colon region abscess EXAM: CT ABDOMEN AND PELVIS WITH CONTRAST TECHNIQUE: Multidetector CT imaging of the abdomen and pelvis was performed using the standard protocol following bolus administration of intravenous contrast. Oral contrast was also administered. CONTRAST:  151mL ISOVUE-300 IOPAMIDOL (ISOVUE-300) INJECTION 61% COMPARISON:  100 mL Isovue-300 nonionic FINDINGS: Lower chest: There is scarring in the posterior left base. No lung base edema or consolidation. There is mild coronary artery calcification. Hepatobiliary: No focal liver lesions are evident. The gallbladder wall is not appreciably thickened. There is no biliary duct dilatation. Pancreas: There is no pancreatic mass or inflammatory focus. Spleen: No splenic lesions are evident. Adrenals/Urinary Tract: Right adrenal appears normal. There is stable left adrenal hypertrophy. There is no evident renal mass on either side. There is an extrarenal pelvis on each side, an anatomic variant. There is moderate fullness of the left renal collecting system. There is no hydronephrosis on the right. No intrarenal calculus is seen on either side. There is no ureteral calculus. There is abrupt change in the contour of the mid left ureter at the site of apparent sigmoid abscess, likely causing inflammation of the mid left ureter. Urinary bladder is midline with wall thickness within normal limits. Stomach/Bowel: There remains thickening of the mid sigmoid colon wall extending over a distance of approximately 12 cm. This inflammation was present previously. In comparison with the previous study, there is now  extensive air in a thick-walled collection just to the left of the sigmoid diverticulitis containing multiple foci of air measuring 5.2 x 4.2 cm. There is felt to be localized extraluminal air in this region consistent with microperforations. There is fluid along the more inferior, lateral aspect of this complex collection measuring 2.1 x 1.0 cm, best appreciated on coronal images. No other bowel inflammation evident. There is moderate stool in the colon. Rectum is mildly distended with stool. No free air beyond apparent sigmoid microperforations or portal venous air. The terminal ileal region appears unremarkable. Vascular/Lymphatic: There is no abdominal aortic aneurysm. There is aortic and iliac artery atherosclerosis. Major venous structures appear patent. There is no evident adenopathy  in the abdomen or pelvis. Reproductive: Uterus is anteverted and canted toward the right. There are calcified leiomyomatous lesions within the uterus, stable. No extrauterine pelvic mass identified apart from the changes involving the sigmoid colon. Other: No periappendiceal region inflammatory change. No ascites evident in the abdomen or pelvis. Musculoskeletal: There is degenerative change in the lumbar spine. No blastic or lytic bone lesions. No intramuscular or abdominal wall lesions. IMPRESSION: 1. Sigmoid diverticulitis remains extending over a distance of approximately 12 cm in the mid sigmoid region. There is wall thickening in this area. Immediately to the left of the diverticulitis, there is evidence of a complex collection with air and fluid consistent with suspected combination of abscess and perforation. This area is not well defined at this time, and this area does not have a well-defined wall. Suspect developing recurrent abscess with associated microperforation. Note that an underlying colonic neoplasm with inflammation has not been excluded with this study. 2. No evident abscess or perforation elsewhere. No other  bowel inflammatory change. 3. Hydronephrosis on the left with inflammation at the level of the mid ureter due to the apparent sigmoid region inflammation/developing abscess. No renal or ureteral calculus evident on either side. 4.  Stable left adrenal hypertrophy. 5.  Leiomyomatous uterus. 6. Aortic Atherosclerosis (ICD10-I70.0). There are foci of coronary artery and iliac artery atherosclerosis. These results will be called to the ordering clinician or representative by the Radiologist Assistant, and communication documented in the PACS or [employer]. Electronically Signed   By: Lowella Grip III M.D.   On: 03/05/2020 12:06      Assessment/Plan Emphysema secondary to tobacco abuse Left-sided hydronephrosis secondary to inflammation from below  Diverticulitis with contained microperforation The patient has persistent diverticular changes on her CT scan despite having no symptoms currently.  She denies any pain, has been eating, and no blood in her stool.  We would not recommend a drain at this point but treat her conservatively with just abx therapy.  Agree with zosyn at this point.  She can have CLD today and likely adv to soft diet tomorrow if still doing well and transition to oral abx.  She will need at least 2 weeks if not longer.  Ideally would like to cool her off to get a colonoscopy and then plan a 1 stage procedure for colon resection if able.  As inflammation resolves, hopefully her hydro does as well.  She would likely require a stent to be placed prior to surgery.  However, if all of this fails and she doesn't improve, she may require a 2 stage procedure with a colostomy, which would generally be temporary.  She would need to stop smoking.   FEN - CLD/IVFs VTE - lovenox ID - [email or web address]   Olivia Gomez, [facility] Surgery 03/06/2020, 11:08 AM Please see Amion for pager number during day hours 7:00am-4:30pm or 7:00am -11:30am on weekends

## 2020-03-06 NOTE — Progress Notes (Signed)
[facility]  PROGRESS NOTE  Olivia Gomez [id number] DOB: 02-16-1946 DOA: 03/05/2020 PCP: Marda Stalker, PA-C   Brief HPI:   74 year old female with medical history of hyperlipidemia, tobacco use, emphysema was sent to the ED from her home for abnormal CT scan.  Patient was diagnosed with diverticulitis/colitis possible abscess formation by [facility] and has been on antibiotics at home.  She completed course of Augmentin as outpatient.  Repeat CT scan was improving.  However CT scan performed today showed worsening abscess formation with perforation.  Patient was called by the GI office to go to the ED for further evaluation.   Subjective   Patient seen and examined, denies any pain.  No nausea or vomiting.  IR and general surgery was consulted yesterday.  IR will possibly drain the abscess today.   Assessment/Plan:     1. Diverticular abscess with perforation-CT scan showed sigmoid diverticulitis extending a distance of 12 cm in the mid sigmoid region with complex collection of fluid and air consistent with combination of abscess and perforation.  Gastroenterology was consulted recommended IR to drain the abscess and placement of drain.  IR has been consulted.  Likely procedure today.  General surgery also was consulted by ED physician, recommend to continue with current management at some point patient will need sigmoid resection.  Keep NPO.  Continue IV normal saline.  Continue IV Zosyn.  WBC is down to 13,000.     SpO2: 97 %   COVID-19 Labs  No results for input(s): DDIMER, FERRITIN, LDH, CRP in the last 72 hours.  Lab Results  Component Value Date   [place] NEGATIVE 03/05/2020     CBG: No results for input(s): GLUCAP in the last 168 hours.  CBC: Recent Labs  Lab 03/05/20 1433 03/05/20 2109 03/06/20 0440  WBC 17.7* 18.3* 13.4*  NEUTROABS 14.0*  --   --   HGB 12.2 11.3* 10.6*  HCT 37.7 35.0* 32.6*  MCV 96.2 95.6 95.0  PLT 434* 404* 358    Basic  Metabolic Panel: Recent Labs  Lab 03/05/20 1433 03/05/20 2109 03/06/20 0440  NA 136  --  140  K 3.4*  --  3.5  CL 100  --  106  CO2 26  --  24  GLUCOSE 104*  --  93  BUN 10  --  6*  CREATININE 0.58 0.65 0.56  CALCIUM 9.5  --  8.5*     Liver Function Tests: Recent Labs  Lab 03/05/20 1433 03/06/20 0440  AST 21 14*  ALT 14 10  ALKPHOS 76 60  BILITOT 0.6 0.7  PROT 7.2 5.5*  ALBUMIN 3.9 2.7*        DVT prophylaxis: Lovenox  Code Status: Full code  Family Communication: No family at bedside  Disposition Plan:   Status is: Inpatient  Dispo: The patient is from: Home              Anticipated d/c is to: Home              Anticipated d/c date is: 03/09/2020              Patient currently admitted with diverticular abscess with perforation.  Barrier to discharge-patient will need abscess to be drained per IR and also surgical evaluation for sigmoid resection  at some point.        Scheduled medications:  . bisacodyl  10 mg Rectal Once  . enoxaparin (LOVENOX) injection  40 mg Subcutaneous Q24H  . nicotine  21 mg Transdermal Daily    Consultants: General surgery IR Gastroenterology  Procedures:    Antibiotics:   Anti-infectives (From admission, onward)   Start     Dose/Rate Route Frequency Ordered Stop   03/06/20 0200  piperacillin-tazobactam (ZOSYN) IVPB 3.375 g     3.375 g 12.5 mL/hr over 240 Minutes Intravenous Every 8 hours 03/05/20 1750     03/05/20 1700  piperacillin-tazobactam (ZOSYN) IVPB 3.375 g     3.375 g 100 mL/hr over 30 Minutes Intravenous  Once 03/05/20 1659 03/05/20 1744       Objective   Vitals:   03/05/20 1938 03/05/20 2025 03/06/20 0203 03/06/20 0517  BP: 135/68 (!) 120/95 (!) 135/59 129/67  Pulse: 86 74 65 (!) 57  Resp: 16 18 18 16   Temp:  98.3 F (36.8 C) 98.5 F (36.9 C) 98 F (36.7 C)  TempSrc:  Oral Oral Oral  SpO2: 100% 98% 99% 97%    Intake/Output Summary (Last 24 hours) at 03/06/2020 1043 Last data filed  at 03/06/2020 0859 Gross per 24 hour  Intake 1113.06 ml  Output 1501 ml  Net -387.94 ml    05/16 1901 - 05/18 0700 In: 1113.1 [I.V.:913.1] Out: 1200 [Urine:1200]  There were no vitals filed for this visit.  Physical Examination:    General: Appears in no acute distress  Cardiovascular: S1-S2, regular, no murmur auscultated  Respiratory: Clear to auscultation bilaterally, no wheezing or crackles auscultated  Abdomen: Soft, nontender, no organomegaly, no rigidity or guarding  Extremities: No edema in the lower extremities  Neurologic: Alert, oriented x3, intact insight and judgment, no focal deficit noted    Data Reviewed:   Recent Results (from the past 240 hour(s))  SARS Coronavirus 2 by RT PCR (hospital order, performed in [place] hospital lab) Nasopharyngeal Nasopharyngeal Swab     Status: None   Collection Time: 03/05/20  5:17 PM   Specimen: Nasopharyngeal Swab  Result Value Ref Range Status   SARS Coronavirus 2 NEGATIVE NEGATIVE Final    Comment: (NOTE) SARS-CoV-2 target nucleic acids are NOT DETECTED. The SARS-CoV-2 RNA is generally detectable in upper and lower respiratory specimens during the acute phase of infection. The lowest concentration of SARS-CoV-2 viral copies this assay can detect is 250 copies / mL. A negative result does not preclude SARS-CoV-2 infection and should not be used as the sole basis for treatment or other patient management decisions.  A negative result may occur with improper specimen collection / handling, submission of specimen other than nasopharyngeal swab, presence of viral mutation(s) within the areas targeted by this assay, and inadequate number of viral copies (<250 copies / mL). A negative result must be combined with clinical observations, patient history, and epidemiological information. Fact Sheet for Patients:   StrictlyIdeas.no Fact Sheet for Healthcare  Providers: BankingDealers.co.za This test is not yet approved or cleared  by the [place] FDA and has been authorized for detection and/or diagnosis of SARS-CoV-2 by FDA under an Emergency Use Authorization (EUA).  This EUA will remain in effect (meaning this test can be used) for the duration of the COVID-19 declaration under Section 564(b)(1) of the Act, 21 U.S.C. section 360bbb-3(b)(1), unless the authorization is terminated or revoked sooner. Performed at [facility], [place] [address], [place], [place] [address]      Studies:  CT ABDOMEN PELVIS W CONTRAST  Result Date: 03/05/2020 CLINICAL DATA:  Sigmoid colon region abscess EXAM: CT ABDOMEN AND PELVIS WITH CONTRAST TECHNIQUE: Multidetector CT imaging of  the abdomen   IMPRESSION: 1. Sigmoid diverticulitis remains extending over a distance of approximately 12 cm in the mid sigmoid region. There is wall thickening in this area. Immediately to the left of the diverticulitis, there is evidence of a complex collection with air and fluid consistent with suspected combination of abscess and perforation. This area is not well defined at this time, and this area does not have a well-defined wall. Suspect developing recurrent abscess with associated microperforation. Note that an underlying colonic neoplasm with inflammation has not been excluded with this study. 2. No evident abscess or perforation elsewhere. No other bowel inflammatory change. 3. Hydronephrosis on the left with inflammation at the level of the mid ureter due to the apparent sigmoid region inflammation/developing abscess. No renal or ureteral calculus evident on either side. 4.  Stable left adrenal hypertrophy. 5.  Leiomyomatous uterus. 6. Aortic Atherosclerosis (ICD10-I70.0). There are foci of coronary artery and iliac artery atherosclerosis. These results will be called to the ordering clinician or representative by the Radiologist  Assistant, and communication documented in the PACS or [employer]. Electronically Signed   By: Lowella Grip III M.D.   On: 03/05/2020 12:06       [place]   [facility] If 7PM-7AM, please contact night-coverage at [email or web address], Office  [phone]   03/06/2020, 10:43 AM  LOS: 1 day

## 2020-03-07 LAB — COMPREHENSIVE METABOLIC PANEL
ALT: 9 U/L (ref 0–44)
AST: 16 U/L (ref 15–41)
Albumin: 2.7 g/dL — ABNORMAL LOW (ref 3.5–5.0)
Alkaline Phosphatase: 56 U/L (ref 38–126)
Anion gap: 12 (ref 5–15)
BUN: 10 mg/dL (ref 8–23)
CO2: 20 mmol/L — ABNORMAL LOW (ref 22–32)
Calcium: 8.4 mg/dL — ABNORMAL LOW (ref 8.9–10.3)
Chloride: 108 mmol/L (ref 98–111)
Creatinine, Ser: 0.65 mg/dL (ref 0.44–1.00)
GFR calc Af Amer: >60 mL/min (ref >60)
GFR calc non Af Amer: >60 mL/min (ref >60)
Glucose, Bld: 62 mg/dL — ABNORMAL LOW (ref 70–99)
Potassium: 3.7 mmol/L (ref 3.5–5.1)
Sodium: 140 mmol/L (ref 135–145)
Total Bilirubin: 1 mg/dL (ref 0.3–1.2)
Total Protein: 5.5 g/dL — ABNORMAL LOW (ref 6.5–8.1)

## 2020-03-07 LAB — CBC
HCT: 33.7 % — ABNORMAL LOW (ref 36.0–46.0)
Hemoglobin: 10.6 g/dL — ABNORMAL LOW (ref 12.0–15.0)
MCH: 31.1 pg (ref 26.0–34.0)
MCHC: 31.5 g/dL (ref 30.0–36.0)
MCV: 98.8 fL (ref 80.0–100.0)
Platelets: 340 10*3/uL (ref 150–400)
RBC: 3.41 MIL/uL — ABNORMAL LOW (ref 3.87–5.11)
RDW: 13.8 % (ref 11.5–15.5)
WBC: 12.3 10*3/uL — ABNORMAL HIGH (ref 4.0–10.5)
nRBC: 0 % (ref 0.0–0.2)

## 2020-03-07 LAB — GLUCOSE, CAPILLARY
Glucose-Capillary: 49 mg/dL — ABNORMAL LOW (ref 70–99)
Glucose-Capillary: 100 mg/dL — ABNORMAL HIGH (ref 70–99)
Glucose-Capillary: 170 mg/dL — ABNORMAL HIGH (ref 70–99)
Glucose-Capillary: 117 mg/dL — ABNORMAL HIGH (ref 70–99)
Glucose-Capillary: 107 mg/dL — ABNORMAL HIGH (ref 70–99)

## 2020-03-07 MED ORDER — KATE FARMS STANDARD 1.4 PO LIQD
325.0000 mL | Freq: Two times a day (BID) | ORAL | Status: DC
  Administered 2020-03-07: 325 mL via ORAL
  Filled 2020-03-07 (×3): qty 325

## 2020-03-07 MED ORDER — INSULIN ASPART 100 UNIT/ML ~~LOC~~ SOLN: 0.0000 [IU] | Freq: Three times a day (TID) | SUBCUTANEOUS | Status: DC

## 2020-03-07 MED ORDER — DEXTROSE-NACL 5-0.9 % IV SOLN: INTRAVENOUS | Status: DC

## 2020-03-07 NOTE — Progress Notes (Signed)
Nutrition Brief Note  Received secure message from pt's RN that pt had additional diet-related questions.  Spoke with pt over the phone and pt very concerned about possible constipation after following a low fiber diet for 6 week. Explained to patient purpose of following low fiber diet and that this is temporary and when she transitions to high fiber diet this would aid in bowel regularity. Pt continues to have concerns but expressed understanding of information.  Pt states she has not eaten anything yet today except for [employer] and juice. Recommended pt order a late lunch tray, now that she is on a soft diet.    If further nutrition issues arise, please consult RD.   Clayton Bibles, MS, RD, LDN Inpatient Clinical Dietitian Contact information available via Amion

## 2020-03-07 NOTE — Progress Notes (Signed)
Pt states she no longer wishes for anyone to give updates or information to her brother Jakala Corleto in the future. NT Rodena Piety witnessed the conversation.

## 2020-03-07 NOTE — Progress Notes (Signed)
Spoke with pts brother, Shanon Brow on the phone. Asked permission from pt to give update to him. Permission granted. Updated given. Brother states he will call back for another update in the AM.

## 2020-03-07 NOTE — Progress Notes (Signed)
Olivia Gomez 10:21 AM  Subjective: Patient doing well wants to go home her case discussed with our PA the surgical team and interventional radiology and we answered all of her questions  Objective: Vital signs stable afebrile no acute distress abdomen is soft nontender white count 12  Assessment: Difficult to treat diverticulitis patient looks and feels much better than her scan  Plan: Agree with slowly advancing her diet and transitioning to a different oral regimen and hopefully she can go home soon with close follow-up with Dr. Therisa Doyne in the office and consider her preop colonoscopy the day before her surgery if all of that can be arranged so as not to have to go drink the prep twice and please let us know if we can be of any further assistance with this hospital stay  [facility]  office [phone] After 5PM or if no answer call [phone]

## 2020-03-07 NOTE — Progress Notes (Signed)
Patients blood glucose checked at 0900 and was 49. Patient given apple juice and blood glucose rechecked at 0930 and was 100.

## 2020-03-07 NOTE — Progress Notes (Signed)
Initial Nutrition Assessment  INTERVENTION:   [employer] 1.4 BID, each provides 455 kcals and 20g protein -Provided "Low Fiber Nutrition Therapy" and "High Fiber Nutrition Therapy" handouts  NUTRITION DIAGNOSIS:   Increased nutrient needs related to acute illness(diverticular abscess) as evidenced by estimated needs.  GOAL:   Patient will meet greater than or equal to 90% of their needs  MONITOR:   PO intake, Supplement acceptance, Labs, Weight trends, I & O's  REASON FOR ASSESSMENT:   Consult, Malnutrition Screening Tool Diet education  ASSESSMENT:   74 year old female with medical history of hyperlipidemia, tobacco use, emphysema was sent to the ED from her home for abnormal CT scan.  Patient was diagnosed with diverticulitis/colitis possible abscess formation by [facility] and has been on antibiotics at home.  She completed course of Augmentin as outpatient.  Repeat CT scan was improving.  However CT scan performed today showed worsening abscess formation with perforation. Admitted with diverticular abscess  RD consulted for nutrition education regarding a low fiber diet to high fiber diet for diverticulitis.  RD provided "Low Fiber Nutrition Therapy" and "High Fiber Nutrition Therapy" handouts from [employer]. Reviewed patient's dietary recall. Provided examples of low and high fiber foods. Discouraged intake of high fiber foods, high fat foods, spicy foods, processed foods, caffeine and red meats when having a flare. Encouraged pt to cook foods until they are soft and chew foods well to help aid in digestion. Also recommend frequent small meals. Encouraged use of a multi-vitamin and protein supplements while having a flare.    Per MD, pt to follow soft diet for 6 weeks. Reviewed how to slowly add back high fiber foods after the 6 weeks.  Expect good compliance. Answered all of pt's questions and pt took notes during education.   Pt reports she has lost  weight but unable to specify amount. Pt has not been weighed this admission. Recommend weighing pt if does not discharge today.   Per GI note, pt in consideration for drain. Will monitor for plan.  I/Os: +2.2L since admit UOP: 1350 ml x 24 hrs Medications: D5 infusion Labs reviewed: CBGs: 49-100  NUTRITION - FOCUSED PHYSICAL EXAM:  Deferred.  Diet Order:   Diet Order            DIET SOFT Room service appropriate? Yes; Fluid consistency: Thin  Diet effective now              EDUCATION NEEDS:   Education needs have been addressed  Skin:  Skin Assessment: Reviewed RN Assessment  Last BM:  5/17  Height:   Ht Readings from Last 1 Encounters:  12/21/17 5\' 2"  (1.575 m)    Weight:   Wt Readings from Last 1 Encounters:  12/21/17 56.6 kg   BMI:  There is no height or weight on file to calculate BMI.  Estimated Nutritional Needs:   Kcal:  1400-1600  Protein:  60-75g  Fluid:  1.6L/day   Olivia Bibles, MS, RD, LDN Inpatient Clinical Dietitian Contact information available via Amion

## 2020-03-07 NOTE — Progress Notes (Signed)
PROGRESS NOTE    Olivia Gomez  [id number] DOB: 07/07/1946 DOA: 03/05/2020 PCP: Marda Stalker, PA-C   Chef Complaints: Abdominal pain  Brief Narrative: 74 year old female with hyperlipidemia, tobacco use, emphysema sent to the ED by GI physician due to abnormal CT showing diverticulitis with contained microperforation.  Patient followed by Dr. Therisa Doyne in [place] for abdominal symptoms in March, diagnosed with diverticulitis based on CT scan in April was treated with Augmentin follow-up CT scan several weeks later showed improvement, colonoscopy has been recommended.  Patient had a follow-up CT scan for unclear reasons and that showed sigmoid diverticulitis over 12 cm of the sigmoid colon with a complex collection of fluid and some fluid consistent with contained perforation, with leukocytosis, patient was admitted. Gastroenterology, surgery and IR consulted.  Subjective:  Afebrile overnight, saturating well on room air. Leukocytosis improving 12.3, hypoglycemia sugar 62, bicarb 20, albumin 2.7. Patient feels improved.  Assessment & Plan:  Diverticular abscess with perforation CT showing evidence of sigmoid diverticulitis extending 12 cm with a complex collection of fluid near.  Appreciate GI and surgery input.  IR has been consulted.  Leukocytosis improving, patient remains on Zosyn, IV fluids clear liquid diet and diet is being advanced.  There is question of whether drain will be beneficial or not and IR GI and surgery has discussed about it and currently holding.  CBC in the morning.  Hypoglycemia 62 and blood sugar check, I will change IV fluids to D5 normal saline.  Starting diet.  Emphysema stable  Metabolic acidosis bicarb of 20 in the setting of #1.  Monitor   DVT prophylaxis:lovenox Code Status:full  Family Communication: plan of care discussed with patient at bedside.  Status is: Inpatient Remains inpatient appropriate because:IV treatments appropriate due to  intensity of illness or inability to take PO and Inpatient level of care appropriate due to severity of illness in the setting of diverticular abscess with perforation and for iv antibiotics.  Dispo: The patient is from: Home              Anticipated d/c is to: Home              Anticipated d/c date is: 1 day              Patient currently is not medically stable to d/c. Nutrition: Diet Order            DIET SOFT Room service appropriate? Yes; Fluid consistency: Thin  Diet effective now             Consultants:see note  Procedures:see note Microbiology:see note  Medications: Scheduled Meds: . enoxaparin (LOVENOX) injection  40 mg Subcutaneous Q24H  . feeding supplement ([employer] STANDARD 1.4)  325 mL Oral BID BM  . nicotine  21 mg Transdermal Daily   Continuous Infusions: . dextrose 5 % and 0.9% NaCl 100 mL/hr at 03/07/20 0915  . piperacillin-tazobactam (ZOSYN)  IV 3.375 g (03/07/20 0916)    Antimicrobials: Anti-infectives (From admission, onward)   Start     Dose/Rate Route Frequency Ordered Stop   03/06/20 0200  piperacillin-tazobactam (ZOSYN) IVPB 3.375 g     3.375 g 12.5 mL/hr over 240 Minutes Intravenous Every 8 hours 03/05/20 1750     03/05/20 1700  piperacillin-tazobactam (ZOSYN) IVPB 3.375 g     3.375 g 100 mL/hr over 30 Minutes Intravenous  Once 03/05/20 1659 03/05/20 1744       Objective: Vitals: Today's Vitals   03/07/20 0300 03/07/20 0540 03/07/20  0944 03/07/20 1308  BP:  (!) 143/60    Pulse:  62    Resp:  16    Temp:  98.1 F (36.7 C)    TempSrc:  Oral    SpO2:  99%    Weight:    60.8 kg  Height:    5\' 2"  (1.575 m)  PainSc: Asleep  0-No pain     Intake/Output Summary (Last 24 hours) at 03/07/2020 1413 Last data filed at 03/07/2020 1300 Gross per 24 hour  Intake 4229 ml  Output 1051 ml  Net 3178 ml   Filed Weights   03/07/20 1308  Weight: 60.8 kg   Weight change:    Intake/Output from previous day: 05/18 0701 - 05/19 0700 In: 600  [P.O.:600] Out: 1352 [Urine:1350; Stool:2] Intake/Output this shift: Total I/O In: 3629 [P.O.:360; I.V.:3073.3; IV Piggyback:195.7] Out: 0   Examination:  General exam: AAO X3,NAD, weak appearing. HEENT:Oral mucosa moist, Ear/Nose WNL grossly,dentition normal. Respiratory system: bilaterally clear,no wheezing or crackles,no use of accessory muscle, non tender. Cardiovascular system: S1 & S2 +, regular, No JVD. Gastrointestinal system: Abdomen soft, NT,ND, BS+. Nervous System:Alert, awake, moving extremities and grossly nonfocal Extremities: No edema, distal peripheral pulses palpable.  Skin: No rashes,no icterus. MSK: Normal muscle bulk,tone, power  Data Reviewed: I have personally reviewed following labs and imaging studies CBC: Recent Labs  Lab 03/05/20 1433 03/05/20 2109 03/06/20 0440 03/07/20 0422  WBC 17.7* 18.3* 13.4* 12.3*  NEUTROABS 14.0*  --   --   --   HGB 12.2 11.3* 10.6* 10.6*  HCT 37.7 35.0* 32.6* 33.7*  MCV 96.2 95.6 95.0 98.8  PLT 434* 404* 358 [id number]   Basic Metabolic Panel: Recent Labs  Lab 03/05/20 1433 03/05/20 2109 03/06/20 0440 03/07/20 0422  NA 136  --  140 140  K 3.4*  --  3.5 3.7  CL 100  --  106 108  CO2 26  --  24 20*  GLUCOSE 104*  --  93 62*  BUN 10  --  6* 10  CREATININE 0.58 0.65 0.56 0.65  CALCIUM 9.5  --  8.5* 8.4*   GFR: Estimated Creatinine Clearance: 52.2 mL/min (by C-G formula based on SCr of 0.65 mg/dL). Liver Function Tests: Recent Labs  Lab 03/05/20 1433 03/06/20 0440 03/07/20 0422  AST 21 14* 16  ALT 14 10 9   ALKPHOS 76 60 56  BILITOT 0.6 0.7 1.0  PROT 7.2 5.5* 5.5*  ALBUMIN 3.9 2.7* 2.7*   No results for input(s): LIPASE, AMYLASE in the last 168 hours. No results for input(s): AMMONIA in the last 168 hours. Coagulation Profile: Recent Labs  Lab 03/06/20 0907  INR 1.0   Cardiac Enzymes: No results for input(s): CKTOTAL, CKMB, CKMBINDEX, TROPONINI in the last 168 hours. BNP (last 3 results) No results for  input(s): PROBNP in the last 8760 hours. HbA1C: No results for input(s): HGBA1C in the last 72 hours. CBG: Recent Labs  Lab 03/07/20 0900 03/07/20 0935 03/07/20 1132  GLUCAP 49* 100* 170*   Lipid Profile: No results for input(s): CHOL, HDL, LDLCALC, TRIG, CHOLHDL, LDLDIRECT in the last 72 hours. Thyroid Function Tests: No results for input(s): TSH, T4TOTAL, FREET4, T3FREE, THYROIDAB in the last 72 hours. Anemia Panel: No results for input(s): VITAMINB12, FOLATE, FERRITIN, TIBC, IRON, RETICCTPCT in the last 72 hours. Sepsis Labs: No results for input(s): PROCALCITON, LATICACIDVEN in the last 168 hours.  Recent Results (from the past 240 hour(s))  SARS Coronavirus 2 by RT PCR (hospital order,  performed in [facility] hospital lab) Nasopharyngeal Nasopharyngeal Swab     Status: None   Collection Time: 03/05/20  5:17 PM   Specimen: Nasopharyngeal Swab  Result Value Ref Range Status   SARS Coronavirus 2 NEGATIVE NEGATIVE Final    Comment: (NOTE) SARS-CoV-2 target nucleic acids are NOT DETECTED. The SARS-CoV-2 RNA is generally detectable in upper and lower respiratory specimens during the acute phase of infection. The lowest concentration of SARS-CoV-2 viral copies this assay can detect is 250 copies / mL. A negative result does not preclude SARS-CoV-2 infection and should not be used as the sole basis for treatment or other patient management decisions.  A negative result may occur with improper specimen collection / handling, submission of specimen other than nasopharyngeal swab, presence of viral mutation(s) within the areas targeted by this assay, and inadequate number of viral copies (<250 copies / mL). A negative result must be combined with clinical observations, patient history, and epidemiological information. Fact Sheet for Patients:   StrictlyIdeas.no Fact Sheet for Healthcare Providers: BankingDealers.co.za This test is  not yet approved or cleared  by the [place] FDA and has been authorized for detection and/or diagnosis of SARS-CoV-2 by FDA under an Emergency Use Authorization (EUA).  This EUA will remain in effect (meaning this test can be used) for the duration of the COVID-19 declaration under Section 564(b)(1) of the Act, 21 U.S.C. section 360bbb-3(b)(1), unless the authorization is terminated or revoked sooner. Performed at [facility], [place] [address] [place],  [address]   Radiology Studies: No results found.   LOS: 2 days   Antonieta Pert, MD [facility]  03/07/2020, 2:13 PM

## 2020-03-07 NOTE — Discharge Instructions (Signed)
Low-Fiber Eating Plan 4-6 weeks, then switch to high fiber diet, see below Fiber is found in fruits, vegetables, whole grains, and beans. Eating a diet low in fiber helps to reduce how often you have bowel movements and how much you produce during a bowel movement. A low-fiber eating plan may help your digestive system heal if:  You have certain conditions, such as Crohn's disease or diverticulitis.  You recently had radiation therapy on your pelvis or bowel.  You recently had intestinal surgery.  You have a new surgical opening in your abdomen (colostomy or ileostomy).  Your intestine is narrowed (stricture). Your health care provider will determine how long you need to stay on this diet. Your health care provider may recommend that you work with a diet and nutrition specialist (dietitian). What are tips for following this plan? General guidelines  Follow recommendations from your dietitian about how much fiber you should have each day.  Most people on this eating plan should try to eat less than 10 grams (g) of fiber each day. Your daily fiber goal is _________________ g.  Take vitamin and mineral supplements as told by your health care provider or dietitian. Chewable or liquid forms are best when on this eating plan. Reading food labels  Check food labels for the amount of dietary fiber.  Choose foods that have less than 2 grams of fiber in one serving. Cooking  Use white flour and other allowed grains for baking and cooking.  Cook meat using methods that keep it tender, such as braising or poaching.  Cook eggs until the yolk is completely solid.  Cook with healthy oils, such as olive oil or canola oil. Meal planning   Eat 5-6 small meals throughout the day instead of 3 large meals.  If you are lactose intolerant: ? Choose low-lactose dairy foods. ? Do not eat dairy foods, if told by your dietitian.  Limit fat and oils to less than 8 teaspoons a day.  Eat small  portions of desserts. What foods are allowed? The items listed below may not be a complete list. Talk with your dietitian about what dietary choices are best for you. Grains All bread and crackers made with white flour. Waffles, pancakes, and [place] toast. Bagels. Pretzels. Melba toast, zwieback, and matzoh. Cooked and dried cereals that do not contain whole grains, added fiber, seeds, or dried fruit. CornmealDomenick Gong. Hot and cold cereals made with refined corn, wheat, rice, or oats. Plain pasta and noodles. White rice. Vegetables Well-cooked or canned vegetables without skin, seeds, or stems. Cooked potatoes without skins. Vegetable juice. Fruits Soft-cooked or canned fruits without skin and seeds. Peeled ripe banana. Applesauce. Fruit juice without pulp. Meats and other protein foods Ground meat. Tender cuts of meat or poultry. Eggs. Fish, seafood, and shellfish. Smooth nut butters. Tofu. Dairy All milk products and drinks. Lactose-free milks, including rice, soy, and almond milks. Yogurt without fruit, nuts, chocolate, or granola mix-ins. Sour cream. Cottage cheese. Cheese. Beverages Decaf coffee. Fruit and vegetable juices or smoothies (in small amounts, with no pulp or skins, and with fruits from allowed list). Sports drinks. Herbal tea. Fats and oils Olive oil, canola oil, sunflower oil, flaxseed oil, and grapeseed oil. Mayonnaise. Cream cheese. Margarine. Butter. Sweets and desserts Plain cakes and cookies. Cream pies and pies made with allowed fruits. Pudding. Custard. Fruit gelatin. Sherbet. Popsicles. Ice cream without nuts. Plain hard candy. Honey. Jelly. Molasses. Syrups, including chocolate syrup. Chocolate. Marshmallows. Gumdrops. Seasoning and other foods Bouillon. Broth.  Cream soups made from allowed foods. Strained soup. Casseroles made with allowed foods. Ketchup. Mild mustard. Mild salad dressings. Plain gravies. Vinegar. Spices in moderation. Salt. Sugar. What foods are not  allowed? The items listed below may not be a complete list. Talk with your dietitian about what dietary choices are best for you. Grains Whole wheat and whole grain breads and crackers. Multigrain breads and crackers. Rye bread. Whole grain or multigrain cereals. Cereals with nuts, raisins, or coconut. Bran. Coarse wheat cereals. Granola. High-fiber cereals. Cornmeal or corn bread. Whole grain pasta. Wild or brown rice. Quinoa. Popcorn. Buckwheat. Wheat germ. Vegetables Potato skins. Raw or undercooked vegetables. All beans and bean sprouts. Cooked greens. Corn. Peas. Cabbage. Beets. Broccoli. Brussels sprouts. Cauliflower. Mushrooms. Onions. Peppers. Parsnips. Okra. Sauerkraut. Fruit Raw or dried fruit. Berries. Fruit juice with pulp. Prune juice. Meats and other protein foods Tough, fibrous meats with gristle. Fatty meat. Poultry with skin. Fried meat, Sales executive, or fish. Deli or lunch meats. Sausage, bacon, and hot dogs. Nuts and chunky nut butter. Dried peas, beans, and lentils. Dairy Yogurt with fruit, nuts, chocolate, or granola mix-ins. Beverages Caffeinated coffee and teas. Fats and oils Avocado. Coconut. Sweets and desserts Desserts, cookies, or candies that contain nuts or coconut. Dried fruit. Jams and preserves with seeds. Marmalade. Any dessert made with fruits or grains that are not allowed. Seasoning and other foods Corn tortilla chips. Soups made with vegetables or grains that are not allowed. Relish. Horseradish. Angie Fava. Olives. Summary  Most people on a low-fiber eating plan should eat less than 10 grams of fiber a day. Follow recommendations from your dietitian about how much fiber you should have each day.  Always check food labels to see the dietary fiber content of packaged foods. In general, a low-fiber food will have fewer than 2 grams of fiber per serving.  In general, try to avoid whole grains, raw fruits and vegetables, dried fruit, tough cuts of meat, nuts, and  seeds.  Take a vitamin and mineral supplement as told by your health care provider or dietitian. This information is not intended to replace advice given to you by your health care provider. Make sure you discuss any questions you have with your health care provider. Document Revised: 01/28/2019 Document Reviewed: 12/09/2016 Elsevier Patient Education  [place].   High-Fiber Diet Fiber, also called dietary fiber, is a type of carbohydrate that is found in fruits, vegetables, whole grains, and beans. A high-fiber diet can have many health benefits. Your health care provider may recommend a high-fiber diet to help:  Prevent constipation. Fiber can make your bowel movements more regular.  Lower your cholesterol.  Relieve the following conditions: ? Swelling of veins in the anus (hemorrhoids). ? Swelling and irritation (inflammation) of specific areas of the digestive tract (uncomplicated diverticulosis). ? A problem of the large intestine (colon) that sometimes causes pain and diarrhea (irritable bowel syndrome, IBS).  Prevent overeating as part of a weight-loss plan.  Prevent heart disease, type 2 diabetes, and certain cancers. What is my plan? The recommended daily fiber intake in grams (g) includes:  38 g for men age 71 or younger.  30 g for men over age 70.  65 g for women age 27 or younger.  21 g for women over age 84. You can get the recommended daily intake of dietary fiber by:  Eating a variety of fruits, vegetables, grains, and beans.  Taking a fiber supplement, if it is not possible to get enough fiber  through your diet. What do I need to know about a high-fiber diet?  It is better to get fiber through food sources rather than from fiber supplements. There is not a lot of research about how effective supplements are.  Always check the fiber content on the nutrition facts label of any prepackaged food. Look for foods that contain 5 g of fiber or more per  serving.  Talk with a diet and nutrition specialist (dietitian) if you have questions about specific foods that are recommended or not recommended for your medical condition, especially if those foods are not listed below.  Gradually increase how much fiber you consume. If you increase your intake of dietary fiber too quickly, you may have bloating, cramping, or gas.  Drink plenty of water. Water helps you to digest fiber. What are tips for following this plan?  Eat a wide variety of high-fiber foods.  Make sure that half of the grains that you eat each day are whole grains.  Eat breads and cereals that are made with whole-grain flour instead of refined flour or white flour.  Eat brown rice, bulgur wheat, or millet instead of white rice.  Start the day with a breakfast that is high in fiber, such as a cereal that contains 5 g of fiber or more per serving.  Use beans in place of meat in soups, salads, and pasta dishes.  Eat high-fiber snacks, such as berries, raw vegetables, nuts, and popcorn.  Choose whole fruits and vegetables instead of processed forms like juice or sauce. What foods can I eat?  Fruits Berries. Pears. Apples. Oranges. Avocado. Prunes and raisins. Dried figs. Vegetables Sweet potatoes. Spinach. Kale. Artichokes. Cabbage. Broccoli. Cauliflower. Green peas. Carrots. Squash. Grains Whole-grain breads. Multigrain cereal. Oats and oatmeal. Brown rice. Barley. Bulgur wheat. [place]. Quinoa. Bran muffins. Popcorn. Rye wafer crackers. Meats and other proteins [place], kidney, and pinto beans. Soybeans. Split peas. Lentils. Nuts and seeds. Dairy Fiber-fortified yogurt. Beverages Fiber-fortified soy milk. Fiber-fortified orange juice. Other foods Fiber bars. The items listed above may not be a complete list of recommended foods and beverages. Contact a dietitian for more options. What foods are not recommended? Fruits Fruit juice. Cooked, strained  fruit. Vegetables Fried potatoes. Canned vegetables. Well-cooked vegetables. Grains White bread. Pasta made with refined flour. White rice. Meats and other proteins Fatty cuts of meat. Fried chicken or fried fish. Dairy Milk. Yogurt. Cream cheese. Sour cream. Fats and oils Butters. Beverages Soft drinks. Other foods Cakes and pastries. The items listed above may not be a complete list of foods and beverages to avoid. Contact a dietitian for more information. Summary  Fiber is a type of carbohydrate. It is found in fruits, vegetables, whole grains, and beans.  There are many health benefits of eating a high-fiber diet, such as preventing constipation, lowering blood cholesterol, helping with weight loss, and reducing your risk of heart disease, diabetes, and certain cancers.  Gradually increase your intake of fiber. Increasing too fast can result in cramping, bloating, and gas. Drink plenty of water while you increase your fiber.  The best sources of fiber include whole fruits and vegetables, whole grains, nuts, seeds, and beans. This information is not intended to replace advice given to you by your health care provider. Make sure you discuss any questions you have with your health care provider. Document Revised: 08/10/2017 Document Reviewed: 08/10/2017 Elsevier Patient Education  [place].   Diverticulitis  Diverticulitis is when small pockets in your large intestine (colon)  get infected or swollen. This causes stomach pain and watery poop (diarrhea). These pouches are called diverticula. They form in people who have a condition called diverticulosis. Follow these instructions at home: Medicines  Take over-the-counter and prescription medicines only as told by your doctor. These include: ? Antibiotics. ? Pain medicines. ? Fiber pills. ? Probiotics. ? Stool softeners.  Do not drive or use heavy machinery while taking prescription pain medicine.  If you were  prescribed an antibiotic, take it as told. Do not stop taking it even if you feel better. General instructions   Follow a diet as told by your doctor.  When you feel better, your doctor may tell you to change your diet. You may need to eat a lot of fiber. Fiber makes it easier to poop (have bowel movements). Healthy foods with fiber include: ? Berries. ? Beans. ? Lentils. ? Green vegetables.  Exercise 3 or more times a week. Aim for 30 minutes each time. Exercise enough to sweat and make your heart beat faster.  Keep all follow-up visits as told. This is important. You may need to have an exam of the large intestine. This is called a colonoscopy. Contact a doctor if:  Your pain does not get better.  You have a hard time eating or drinking.  You are not pooping like normal. Get help right away if:  Your pain gets worse.  Your problems do not get better.  Your problems get worse very fast.  You have a fever.  You throw up (vomit) more than one time.  You have poop that is: ? Bloody. ? Black. ? Tarry. Summary  Diverticulitis is when small pockets in your large intestine (colon) get infected or swollen.  Take medicines only as told by your doctor.  Follow a diet as told by your doctor. This information is not intended to replace advice given to you by your health care provider. Make sure you discuss any questions you have with your health care provider. Document Revised: 09/18/2017 Document Reviewed: 10/23/2016 Elsevier Patient Education  [place].

## 2020-03-07 NOTE — Progress Notes (Signed)
Patient ID: Olivia Gomez, female   DOB: January 04, 1946, 74 y.o.   MRN: [id number] Request received yesterday for possible pelvic abscess drain placement in pt. Images were reviewed by Dr. Pascal Lux and case d/w extensively with GI and [facility]. Area in question appears to be a walled off perf. Pt currently afebrile ,WBC trending down, pt only has minimal discomfort. [facility]CS does not recommend drain placement at this time. Dr. Pascal Lux has spoken to Dr. Dema Severin this morning to confirm this recommendation. Dr. Watt Climes has also been informed of above.

## 2020-03-07 NOTE — Progress Notes (Addendum)
As an addendum I discussed the case with her primary gastroenterologist Dr. Therisa Doyne and she disagrees with the plan and really feels like the patient needs a drain and I rediscussed the case with interventional radiology who will rediscuss it with the surgical team and make a decision on how to proceed

## 2020-03-07 NOTE — Progress Notes (Signed)
Subjective: Patient still asymptomatic with no abdominal pain.  Tolerating clear liquids and ready to eat some food.  Had a partial BM and passed flatus.  ROS: See above, otherwise other systems negative  Objective: Vital signs in last 24 hours: Temp:  [97.9 F (36.6 C)-98.2 F (36.8 C)] 98.1 F (36.7 C) (05/19 0540) Pulse Rate:  [59-67] 62 (05/19 0540) Resp:  [16-18] 16 (05/19 0540) BP: (126-143)/(60-65) 143/60 (05/19 0540) SpO2:  [99 %] 99 % (05/19 0540) Last BM Date: 03/05/20  Intake/Output from previous day: 05/18 0701 - 05/19 0700 In: 600 [P.O.:600] Out: 1352 [Urine:1350; Stool:2] Intake/Output this shift: Total I/O In: 3090.6 [P.O.:240; I.V.:2700.6; IV Piggyback:150] Out: 0   PE: Heart: regular Lungs: CTAB Abd: soft, NT, Nd, +BS  Lab Results:  Recent Labs    03/06/20 0440 03/07/20 0422  WBC 13.4* 12.3*  HGB 10.6* 10.6*  HCT 32.6* 33.7*  PLT 358 340   BMET Recent Labs    03/06/20 0440 03/07/20 0422  NA 140 140  K 3.5 3.7  CL 106 108  CO2 24 20*  GLUCOSE 93 62*  BUN 6* 10  CREATININE 0.56 0.65  CALCIUM 8.5* 8.4*   PT/INR Recent Labs    03/06/20 0907  LABPROT 13.1  INR 1.0   CMP     Component Value Date/Time   NA 140 03/07/2020 0422   K 3.7 03/07/2020 0422   CL 108 03/07/2020 0422   CO2 20 (L) 03/07/2020 0422   GLUCOSE 62 (L) 03/07/2020 0422   BUN 10 03/07/2020 0422   CREATININE 0.65 03/07/2020 0422   CALCIUM 8.4 (L) 03/07/2020 0422   PROT 5.5 (L) 03/07/2020 0422   ALBUMIN 2.7 (L) 03/07/2020 0422   AST 16 03/07/2020 0422   ALT 9 03/07/2020 0422   ALKPHOS 56 03/07/2020 0422   BILITOT 1.0 03/07/2020 0422   GFRNONAA >60 03/07/2020 0422   GFRAA >60 03/07/2020 0422   Lipase  No results found for: LIPASE     Studies/Results: No results found.  Anti-infectives: Anti-infectives (From admission, onward)   Start     Dose/Rate Route Frequency Ordered Stop   03/06/20 0200  piperacillin-tazobactam (ZOSYN) IVPB 3.375 g     3.375 g 12.5 mL/hr over 240 Minutes Intravenous Every 8 hours 03/05/20 1750     03/05/20 1700  piperacillin-tazobactam (ZOSYN) IVPB 3.375 g     3.375 g 100 mL/hr over 30 Minutes Intravenous  Once 03/05/20 1659 03/05/20 1744       Assessment/Plan Emphysema secondary to tobacco abuse Left-sided hydronephrosis secondary to inflammation from below - hopefully this will resolve as diverticular inflammation resolves  Diverticulitis with contained microperforation -remains asymptomatic -will adv to soft diet -if tolerates she may be DC home from our standpoint with oral abx therapy for at least 14 days. -she will follow up with Dr. Therisa Doyne in the office in 1-2 weeks per GI.  She will need a colonoscopy in about 4-6 weeks. -she will then need to follow up with our office to discuss surgical options at that time.  This was all discussed with the patient while Dr. Dema Severin and Dr. Watt Climes were present and all agree with this plan. -discussed plan with Dr. Lupita Leash as well   FEN - soft VTE - lovenox ID - [email or web address]   LOS: 2 days    Olivia Gomez , [facility] 03/07/2020, 10:55 AM Please see Amion for pager number during day hours 7:00am-4:30pm or 7:00am -11:30am on  weekends

## 2020-03-08 LAB — GLUCOSE, CAPILLARY
Glucose-Capillary: 110 mg/dL — ABNORMAL HIGH (ref 70–99)
Glucose-Capillary: 78 mg/dL (ref 70–99)

## 2020-03-08 LAB — COMPREHENSIVE METABOLIC PANEL
ALT: 10 U/L (ref 0–44)
AST: 16 U/L (ref 15–41)
Albumin: 2.7 g/dL — ABNORMAL LOW (ref 3.5–5.0)
Alkaline Phosphatase: 52 U/L (ref 38–126)
Anion gap: 7 (ref 5–15)
BUN: 9 mg/dL (ref 8–23)
CO2: 24 mmol/L (ref 22–32)
Calcium: 8.3 mg/dL — ABNORMAL LOW (ref 8.9–10.3)
Chloride: 108 mmol/L (ref 98–111)
Creatinine, Ser: 0.71 mg/dL (ref 0.44–1.00)
GFR calc Af Amer: >60 mL/min (ref >60)
GFR calc non Af Amer: >60 mL/min (ref >60)
Glucose, Bld: 116 mg/dL — ABNORMAL HIGH (ref 70–99)
Potassium: 3.5 mmol/L (ref 3.5–5.1)
Sodium: 139 mmol/L (ref 135–145)
Total Bilirubin: 0.5 mg/dL (ref 0.3–1.2)
Total Protein: 5.5 g/dL — ABNORMAL LOW (ref 6.5–8.1)

## 2020-03-08 LAB — CBC
HCT: 32.7 % — ABNORMAL LOW (ref 36.0–46.0)
Hemoglobin: 10.4 g/dL — ABNORMAL LOW (ref 12.0–15.0)
MCH: 30.7 pg (ref 26.0–34.0)
MCHC: 31.8 g/dL (ref 30.0–36.0)
MCV: 96.5 fL (ref 80.0–100.0)
Platelets: 347 10*3/uL (ref 150–400)
RBC: 3.39 MIL/uL — ABNORMAL LOW (ref 3.87–5.11)
RDW: 13.5 % (ref 11.5–15.5)
WBC: 9.7 10*3/uL (ref 4.0–10.5)
nRBC: 0 % (ref 0.0–0.2)

## 2020-03-08 MED ORDER — AMOXICILLIN-POT CLAVULANATE 875-125 MG PO TABS
1.0000 | ORAL_TABLET | Freq: Two times a day (BID) | ORAL | 0 refills | Status: DC
Start: 2020-03-08 — End: 2020-03-08

## 2020-03-08 MED ORDER — AMOXICILLIN-POT CLAVULANATE 875-125 MG PO TABS: 1.0000 | ORAL_TABLET | Freq: Two times a day (BID) | ORAL | 0 refills | Status: AC

## 2020-03-08 MED ORDER — LEVOFLOXACIN 500 MG PO TABS: 750.0000 mg | ORAL_TABLET | Freq: Every day | ORAL | Status: DC

## 2020-03-08 MED ORDER — AMOXICILLIN-POT CLAVULANATE 875-125 MG PO TABS: 1.0000 | ORAL_TABLET | Freq: Two times a day (BID) | ORAL | Status: DC

## 2020-03-08 MED ORDER — LEVOFLOXACIN 750 MG PO TABS: 750.0000 mg | ORAL_TABLET | Freq: Every day | ORAL | 0 refills | Status: AC

## 2020-03-08 MED ORDER — LEVOFLOXACIN 750 MG PO TABS: 750.0000 mg | ORAL_TABLET | Freq: Every day | ORAL | 0 refills | Status: DC

## 2020-03-08 MED FILL — AMOX-CLAV 875-125 MG TABLET: 875-125 | 12 days supply | Qty: 24 | Fill #0

## 2020-03-08 MED FILL — levoFLOXacin 750 MG TABS: 750 | 12 days supply | Qty: 12 | Fill #0

## 2020-03-08 NOTE — Plan of Care (Signed)
  Problem: Education: Goal: Knowledge of General Education information will improve Description: Including pain rating scale, medication(s)/side effects and non-pharmacologic comfort measures Outcome: Progressing   Problem: Health Behavior/Discharge Planning: Goal: Ability to manage health-related needs will improve Outcome: Progressing   Problem: Clinical Measurements: Goal: Ability to maintain clinical measurements within normal limits will improve Outcome: Progressing   Problem: Clinical Measurements: Goal: Will remain free from infection Outcome: Progressing   Problem: Clinical Measurements: Goal: Diagnostic test results will improve Outcome: Progressing   Problem: Clinical Measurements: Goal: Respiratory complications will improve Outcome: Progressing   Problem: Clinical Measurements: Goal: Cardiovascular complication will be avoided Outcome: Progressing   Problem: Skin Integrity: Goal: Risk for impaired skin integrity will decrease Outcome: Progressing   Problem: Safety: Goal: Ability to remain free from injury will improve Outcome: Progressing   Problem: Pain Managment: Goal: General experience of comfort will improve Outcome: Progressing   Problem: Elimination: Goal: Will not experience complications related to urinary retention Outcome: Progressing

## 2020-03-08 NOTE — Plan of Care (Signed)
Assessment unchanged. Pt read through and verbalized understanding of dc instructions through teach back including new antibiotics to start at home. Discharged via wc to front entrance accompanied by NT.

## 2020-03-08 NOTE — Discharge Summary (Signed)
Physician Discharge Summary  Benedetta Widmeyer [id number] DOB: August 21, 1946 DOA: 03/05/2020  PCP: Marda Stalker, PA-C  Admit date: 03/05/2020 Discharge date: 03/08/2020  Admitted From: home Disposition:  home  Recommendations for Outpatient Follow-up:  1. Follow up with PCP in 1-2 weeks 2. Please obtain BMP/CBC in one week 3. Please follow up on the following pending results:  Home Health:no  Equipment/Devices: None  Discharge Condition: Stable Code Status: FULL Diet recommendation: LOW FIBER Diet Order            Diet - low sodium heart healthy        DIET SOFT Room service appropriate? Yes; Fluid consistency: Thin  Diet effective now              Brief/Interim Summary: 74 year old female with hyperlipidemia, tobacco use, emphysema sent to the ED by GI physician due to abnormal CT showing diverticulitis with contained microperforation.  Patient followed by Dr. Therisa Doyne in [place] for abdominal symptoms in March, diagnosed with diverticulitis based on CT scan in April was treated with Augmentin follow-up CT scan several weeks later showed improvement, colonoscopy has been recommended.  Patient had a follow-up CT scan for unclear reasons and that showed sigmoid diverticulitis over 12 cm of the sigmoid colon with a complex collection of fluid and some fluid consistent with contained perforation, with leukocytosis, patient was admitted. Gastroenterology, surgery and IR consulted. She was managed conservatively and clinically improved no more leukocytosis no more abdominal pain nausea vomiting even with deep palpation.At this point no plan for percutaneous drainage by IR surgery and Dr. Watt Climes on board, discuss again today the GI team okay for discharge.Patient had bowel movement today.  She feels well.We will discuss with Dr. Megan Salon regarding antibiotic recommendations.Levaquin 750 mg daily and augmentin 875 mg bid (anaerobe/gram positive) Total 2 wks.   Discharge Diagnoses:    Diverticular abscess with perforation:CT showing evidence of sigmoid diverticulitis extending 12 cm with a complex collection of fluid near.  Appreciate GI and surgery input. IR has been consulted.Leukocytosis now has resolved.After discussion Dr. Therisa Doyne Dr. Watt Climes reconsulted surgery and IR to see if patient benefits with abscess drainage and given the associated risk and since patient has clinically improved with IV antibiotics there is no plan for further procedure by IR or surgery and GI is on board.Okay for discharge home today. Consulted with Dr. Bridget Hartshorn who advised to discharge on Augmentin and Levaquin to cover total 2 weeks of antibiotics, does not advise clindamycin and Levaquin given high risk of C Diff.  Consults: Gi,surgery and ir. ID over the phone  Subjective: Feels well had a bowel movement.  No nausea or pain.  Tolerating diet.  Discharge Exam: Vitals:   03/08/20 0546 03/08/20 1136  BP: 124/66 (!) 151/65  Pulse: (!) 49 82  Resp: 18 18  Temp: 97.7 F (36.5 C) 98.2 F (36.8 C)  SpO2: 100% 100%   General: Pt is alert, awake, not in acute distress Cardiovascular: RRR, S1/S2 +, no rubs, no gallops Respiratory: CTA bilaterally, no wheezing, no rhonchi Abdominal: Soft, NT, ND, bowel sounds + Extremities: no edema, no cyanosis  Discharge Instructions  Discharge Instructions    Diet - low sodium heart healthy   Complete by: As directed    Continue on the diet plan.  By dietitian with low fiber diet in the initial period   Discharge instructions   Complete by: As directed    Please follow-up with the GI and surgery clinic as instructed and scheduled.  Please  call their office for follow-up if you do not receive a call in next 2 to 3 days.  Please call call MD or return to ER for similar or worsening recurring problem that brought you to hospital or if any fever,nausea/vomiting,abdominal pain, uncontrolled pain, chest pain,  shortness of breath or any other alarming  symptoms.  Please follow-up your doctor as instructed in a week time and call the office for appointment.  Please avoid alcohol, smoking, or any other illicit substance and maintain healthy habits including taking your regular medications as prescribed.  You were cared for by a hospitalist during your hospital stay.If you have any questions about your discharge medications or the care you received while you were in the hospital after you are discharged,you can call the unit and ask to speak with the hospitalist on call if the hospitalist that took care of you is not available.  Once you are discharged, your primary care physician will handle any further medical issues. Please note that NO REFILLS for any discharge medications will be authorized once you are discharged, as it is imperative that you return to your primary care physician (or establish a relationship with a primary care physician if you do not have one) for your aftercare needs so that they can reassess your need for medications and monitor your lab values   Increase activity slowly   Complete by: As directed      Allergies as of 03/08/2020      Reactions   Flagyl [metronidazole] Hives      Medication List    TAKE these medications   ACIDOPHILUS PO Take 1 capsule by mouth once a week.   amoxicillin-clavulanate 875-125 MG tablet Commonly known as: Augmentin Take 1 tablet by mouth 2 (two) times daily for 12 days.   aspirin EC 81 MG tablet Take 81 mg by mouth daily.   b complex vitamins capsule Take 1 capsule by mouth daily.   Cod Liver Oil Caps Take 1 capsule by mouth daily.   CoQ10 100 MG Caps Take 100 mg by mouth once a week.   FISH OIL PO Take 1,200 mg by mouth daily.   levofloxacin 750 MG tablet Commonly known as: LEVAQUIN Take 1 tablet (750 mg total) by mouth daily for 12 days.   Magnesium 250 MG Tabs Take 250 mg by mouth once a week.   vitamin C 1000 MG tablet Take 1,000 mg by mouth once a week.    Vitamin D 50 MCG (2000 UT) Caps Take 2,000 Units by mouth daily.      Follow-up Information    Ronnette Juniper, MD Follow up in 1 week(s).   Specialty: Gastroenterology Contact information: [address] [place] [address] [phone]        Surgery, [place] Follow up in 6 week(s).   Specialty: General Surgery Why: You may follow up with Dr. Dema Severin again if you would like.  If not, you may see Dr. Johney Maine or Dr. Clydia Llano information: [address] [place] [address] [phone]          Allergies  Allergen Reactions  . Flagyl [Metronidazole] Hives    The results of significant diagnostics from this hospitalization (including imaging, microbiology, ancillary and laboratory) are listed below for reference.    Microbiology: Recent Results (from the past 240 hour(s))  SARS Coronavirus 2 by RT PCR (hospital order, performed in [facility] hospital lab) Nasopharyngeal Nasopharyngeal Swab  Status: None   Collection Time: 03/05/20  5:17 PM   Specimen: Nasopharyngeal Swab  Result Value Ref Range Status   SARS Coronavirus 2 NEGATIVE NEGATIVE Final    Comment: (NOTE) SARS-CoV-2 target nucleic acids are NOT DETECTED. The SARS-CoV-2 RNA is generally detectable in upper and lower respiratory specimens during the acute phase of infection. The lowest concentration of SARS-CoV-2 viral copies this assay can detect is 250 copies / mL. A negative result does not preclude SARS-CoV-2 infection and should not be used as the sole basis for treatment or other patient management decisions.  A negative result may occur with improper specimen collection / handling, submission of specimen other than nasopharyngeal swab, presence of viral mutation(s) within the areas targeted by this assay, and inadequate number of viral copies (<250 copies / mL). A negative result must be combined with clinical observations, patient history, and epidemiological  information. Fact Sheet for Patients:   StrictlyIdeas.no Fact Sheet for Healthcare Providers: BankingDealers.co.za This test is not yet approved or cleared  by the [place] FDA and has been authorized for detection and/or diagnosis of SARS-CoV-2 by FDA under an Emergency Use Authorization (EUA).  This EUA will remain in effect (meaning this test can be used) for the duration of the COVID-19 declaration under Section 564(b)(1) of the Act, 21 U.S.C. section 360bbb-3(b)(1), unless the authorization is terminated or revoked sooner. Performed at [facility], [place] [address] [place], [place] [address]     Procedures/Studies: CT ABDOMEN PELVIS W CONTRAST  Result Date: 03/05/2020 CLINICAL DATA:  Sigmoid colon region abscess EXAM: CT ABDOMEN AND PELVIS WITH CONTRAST TECHNIQUE: Multidetector CT imaging of the abdomen and pelvis was performed using the standard protocol following bolus administration of intravenous contrast. Oral contrast was also administered. CONTRAST:  151mL ISOVUE-300 IOPAMIDOL (ISOVUE-300) INJECTION 61% COMPARISON:  100 mL Isovue-300 nonionic FINDINGS: Lower chest: There is scarring in the posterior left base. No lung base edema or consolidation. There is mild coronary artery calcification. Hepatobiliary: No focal liver lesions are evident. The gallbladder wall is not appreciably thickened. There is no biliary duct dilatation. Pancreas: There is no pancreatic mass or inflammatory focus. Spleen: No splenic lesions are evident. Adrenals/Urinary Tract: Right adrenal appears normal. There is stable left adrenal hypertrophy. There is no evident renal mass on either side. There is an extrarenal pelvis on each side, an anatomic variant. There is moderate fullness of the left renal collecting system. There is no hydronephrosis on the right. No intrarenal calculus is seen on either side. There is no ureteral calculus. There  is abrupt change in the contour of the mid left ureter at the site of apparent sigmoid abscess, likely causing inflammation of the mid left ureter. Urinary bladder is midline with wall thickness within normal limits. Stomach/Bowel: There remains thickening of the mid sigmoid colon wall extending over a distance of approximately 12 cm. This inflammation was present previously. In comparison with the previous study, there is now extensive air in a thick-walled collection just to the left of the sigmoid diverticulitis containing multiple foci of air measuring 5.2 x 4.2 cm. There is felt to be localized extraluminal air in this region consistent with microperforations. There is fluid along the more inferior, lateral aspect of this complex collection measuring 2.1 x 1.0 cm, best appreciated on coronal images. No other bowel inflammation evident. There is moderate stool in the colon. Rectum is mildly distended with stool. No free air beyond apparent sigmoid microperforations or portal venous air. The terminal  ileal region appears unremarkable. Vascular/Lymphatic: There is no abdominal aortic aneurysm. There is aortic and iliac artery atherosclerosis. Major venous structures appear patent. There is no evident adenopathy in the abdomen or pelvis. Reproductive: Uterus is anteverted and canted toward the right. There are calcified leiomyomatous lesions within the uterus, stable. No extrauterine pelvic mass identified apart from the changes involving the sigmoid colon. Other: No periappendiceal region inflammatory change. No ascites evident in the abdomen or pelvis. Musculoskeletal: There is degenerative change in the lumbar spine. No blastic or lytic bone lesions. No intramuscular or abdominal wall lesions. IMPRESSION: 1. Sigmoid diverticulitis remains extending over a distance of approximately 12 cm in the mid sigmoid region. There is wall thickening in this area. Immediately to the left of the diverticulitis, there is  evidence of a complex collection with air and fluid consistent with suspected combination of abscess and perforation. This area is not well defined at this time, and this area does not have a well-defined wall. Suspect developing recurrent abscess with associated microperforation. Note that an underlying colonic neoplasm with inflammation has not been excluded with this study. 2. No evident abscess or perforation elsewhere. No other bowel inflammatory change. 3. Hydronephrosis on the left with inflammation at the level of the mid ureter due to the apparent sigmoid region inflammation/developing abscess. No renal or ureteral calculus evident on either side. 4.  Stable left adrenal hypertrophy. 5.  Leiomyomatous uterus. 6. Aortic Atherosclerosis (ICD10-I70.0). There are foci of coronary artery and iliac artery atherosclerosis. These results will be called to the ordering clinician or representative by the Radiologist Assistant, and communication documented in the PACS or [employer]. Electronically Signed   By: Lowella Grip III M.D.   On: 03/05/2020 12:06   CT ABDOMEN PELVIS W CONTRAST  Result Date: 02/14/2020 CLINICAL DATA:  Diverticulitis with abscess without bleeding. Completed antibiotic course yesterday. No problems at this time. Previous appendectomy. EXAM: CT ABDOMEN AND PELVIS WITH CONTRAST TECHNIQUE: Multidetector CT imaging of the abdomen and pelvis was performed using the standard protocol following bolus administration of intravenous contrast. CONTRAST:  141mL ISOVUE-300 IOPAMIDOL (ISOVUE-300) INJECTION 61% COMPARISON:  01/30/2020 FINDINGS: Lower chest: Minimal scarring posterior left base. Calcification over the left anterior descending coronary artery. Minimal calcification over the descending thoracic aorta. Hepatobiliary: Liver, gallbladder and biliary tree are normal. Pancreas: Normal. Spleen: Normal. Adrenals/Urinary Tract: Adrenal glands are normal and symmetric. Kidneys normal size  with mild stable fullness of the intrarenal collecting systems and mild stable prominence of the left mid ureter likely secondary to the inflammatory process involving the sigmoid colon. Bladder is normal. Stomach/Bowel: Stomach and small bowel are normal. Previous appendectomy. Contrast is present throughout the colon. Moderate fecal retention over the rectum. Diverticulosis of the distal descending and sigmoid colon. There is persistent moderate uniform wall thickening involving an approximate 12 cm segment of sigmoid colon. No significant adjacent inflammatory change or free fluid. Few tiny air collections along the peripheral border of the inflamed sigmoid colon without definite free air. The previously seen 4.5 cm rim enhancing fluid collection abutting the left lateral aspect of the sigmoid colon has significantly decreased in size as there is only a small residual 9 mm focal fluid collection remaining. This likely represents a resolving diverticular abscess and less likely resolving left ovarian process. No significant adjacent adenopathy. Vascular/Lymphatic: Mild-to-moderate calcified plaque over the abdominal aorta which is normal in caliber. No significant adenopathy. Reproductive: Uterus is to the right of midline with moderate calcified fibroids unchanged. Right ovary not  well visualized. Other: None. Musculoskeletal: Degenerative change of the spine with significant disc space narrowing at the L4-5 level. Slight grade 1 anterolisthesis of L3 on L4 unchanged. IMPRESSION: 1. Colonic diverticulosis with persistent significant uniform wall thickening involving an approximate 12 cm segment of sigmoid colon which could be due to an acute colitis, diverticulitis and less likely neoplasm. Previously seen 4.5 cm loculated cystic collection abutting the left lateral wall of the sigmoid colon has nearly resolved with only a small residual 9 mm cystic collection. This may represent resolving diverticular abscess  and less likely improving left ovarian process. Recommend an additional follow-up CT as endoscopic evaluation may be warranted if this sigmoid wall thickening does not resolve. 2. Mild stable fullness of the intrarenal collecting systems and left mid ureter likely secondary to the inflammatory process involving the sigmoid colon. 3. Aortic Atherosclerosis (ICD10-I70.0). Atherosclerotic coronary artery disease. 4.  Calcified uterine fibroids. Electronically Signed   By: Marin Olp M.D.   On: 02/14/2020 10:24    Labs: BNP (last 3 results) No results for input(s): BNP in the last 8760 hours. Basic Metabolic Panel: Recent Labs  Lab 03/05/20 1433 03/05/20 2109 03/06/20 0440 03/07/20 0422 03/08/20 0449  NA 136  --  140 140 139  K 3.4*  --  3.5 3.7 3.5  CL 100  --  106 108 108  CO2 26  --  24 20* 24  GLUCOSE 104*  --  93 62* 116*  BUN 10  --  6* 10 9  CREATININE 0.58 0.65 0.56 0.65 0.71  CALCIUM 9.5  --  8.5* 8.4* 8.3*   Liver Function Tests: Recent Labs  Lab 03/05/20 1433 03/06/20 0440 03/07/20 0422 03/08/20 0449  AST 21 14* 16 16  ALT 14 10 9 10   ALKPHOS 76 60 56 52  BILITOT 0.6 0.7 1.0 0.5  PROT 7.2 5.5* 5.5* 5.5*  ALBUMIN 3.9 2.7* 2.7* 2.7*   No results for input(s): LIPASE, AMYLASE in the last 168 hours. No results for input(s): AMMONIA in the last 168 hours. CBC: Recent Labs  Lab 03/05/20 1433 03/05/20 2109 03/06/20 0440 03/07/20 0422 03/08/20 0449  WBC 17.7* 18.3* 13.4* 12.3* 9.7  NEUTROABS 14.0*  --   --   --   --   HGB 12.2 11.3* 10.6* 10.6* 10.4*  HCT 37.7 35.0* 32.6* 33.7* 32.7*  MCV 96.2 95.6 95.0 98.8 96.5  PLT 434* 404* 358 340 347   Cardiac Enzymes: No results for input(s): CKTOTAL, CKMB, CKMBINDEX, TROPONINI in the last 168 hours. BNP: Invalid input(s): POCBNP CBG: Recent Labs  Lab 03/07/20 0935 03/07/20 1132 03/07/20 1552 03/07/20 2206 03/08/20 0735  GLUCAP 100* 170* 117* 107* 110*   D-Dimer No results for input(s): DDIMER in the last  72 hours. Hgb A1c No results for input(s): HGBA1C in the last 72 hours. Lipid Profile No results for input(s): CHOL, HDL, LDLCALC, TRIG, CHOLHDL, LDLDIRECT in the last 72 hours. Thyroid function studies No results for input(s): TSH, T4TOTAL, T3FREE, THYROIDAB in the last 72 hours.  Invalid input(s): FREET3 Anemia work up No results for input(s): VITAMINB12, FOLATE, FERRITIN, TIBC, IRON, RETICCTPCT in the last 72 hours. Urinalysis No results found for: COLORURINE, APPEARANCEUR, LABSPEC, [place], GLUCOSEU, [place], [place], KETONESUR, PROTEINUR, UROBILINOGEN, NITRITE, LEUKOCYTESUR Sepsis Labs Invalid input(s): PROCALCITONIN,  WBC,  LACTICIDVEN Microbiology Recent Results (from the past 240 hour(s))  SARS Coronavirus 2 by RT PCR (hospital order, performed in [facility] hospital lab) Nasopharyngeal Nasopharyngeal Swab     Status: None  Collection Time: 03/05/20  5:17 PM   Specimen: Nasopharyngeal Swab  Result Value Ref Range Status   SARS Coronavirus 2 NEGATIVE NEGATIVE Final    Comment: (NOTE) SARS-CoV-2 target nucleic acids are NOT DETECTED. The SARS-CoV-2 RNA is generally detectable in upper and lower respiratory specimens during the acute phase of infection. The lowest concentration of SARS-CoV-2 viral copies this assay can detect is 250 copies / mL. A negative result does not preclude SARS-CoV-2 infection and should not be used as the sole basis for treatment or other patient management decisions.  A negative result may occur with improper specimen collection / handling, submission of specimen other than nasopharyngeal swab, presence of viral mutation(s) within the areas targeted by this assay, and inadequate number of viral copies (<250 copies / mL). A negative result must be combined with clinical observations, patient history, and epidemiological information. Fact Sheet for Patients:   StrictlyIdeas.no Fact Sheet for Healthcare  Providers: BankingDealers.co.za This test is not yet approved or cleared  by the [place] FDA and has been authorized for detection and/or diagnosis of SARS-CoV-2 by FDA under an Emergency Use Authorization (EUA).  This EUA will remain in effect (meaning this test can be used) for the duration of the COVID-19 declaration under Section 564(b)(1) of the Act, 21 U.S.C. section 360bbb-3(b)(1), unless the authorization is terminated or revoked sooner. Performed at [facility], [place] [address], [place], [place] [address]      Time coordinating discharge: 35  minutes  SIGNED: Antonieta Pert, MD  [facility] 03/08/2020, 11:53 AM  If 7PM-7AM, please contact night-coverage [email or web address]

## 2020-03-08 NOTE — Progress Notes (Signed)
[facility] Gastroenterology Progress Note  Olivia Gomez 74 y.o. 1946-04-02  CC:  Diverticular abscess  Subjective: Patient feels well today and denies any complaints.  Denies any abdominal pain, nausea, and vomiting.  Had a soft bowel movement without any melena or hematochezia.  Tolerated breakfast of scrambled eggs and potatoes.  Is looking forward to discharge.  ROS : Review of Systems  Constitutional: Negative for chills and fever.  Cardiovascular: Negative for chest pain and palpitations.  Gastrointestinal: Negative for abdominal pain, blood in stool, constipation, diarrhea, heartburn, melena, nausea and vomiting.    Objective: Vital signs in last 24 hours: Vitals:   03/08/20 0546 03/08/20 1136  BP: 124/66 (!) 151/65  Pulse: (!) 49 82  Resp: 18 18  Temp: 97.7 F (36.5 C) 98.2 F (36.8 C)  SpO2: 100% 100%    Physical Exam:  General:  Alert, oriented, cooperative, no acute distress  Head:  Normocephalic, without obvious abnormality, atraumatic  Eyes:  Anicteric sclera, EOM's intact  Lungs:   Clear to auscultation bilaterally, respirations unlabored  Heart:  Regular rate and rhythm, S1, S2 normal  Abdomen:   Soft, non-tender, nondistended, normoactive bowel sounds active all four quadrants,  no guarding or peritoneal signs  Extremities: Extremities normal, atraumatic, no  edema  Pulses: 2+ and symmetric    Lab Results: Recent Labs    03/07/20 0422 03/08/20 0449  NA 140 139  K 3.7 3.5  CL 108 108  CO2 20* 24  GLUCOSE 62* 116*  BUN 10 9  CREATININE 0.65 0.71  CALCIUM 8.4* 8.3*   Recent Labs    03/07/20 0422 03/08/20 0449  AST 16 16  ALT 9 10  ALKPHOS 56 52  BILITOT 1.0 0.5  PROT 5.5* 5.5*  ALBUMIN 2.7* 2.7*   Recent Labs    03/05/20 1433 03/05/20 2109 03/07/20 0422 03/08/20 0449  WBC 17.7*   < > 12.3* 9.7  NEUTROABS 14.0*  --   --   --   HGB 12.2   < > 10.6* 10.4*  HCT 37.7   < > 33.7* 32.7*  MCV 96.2   < > 98.8 96.5  PLT 434*   < > 340 347   < >  = values in this interval not displayed.   Recent Labs    03/06/20 0907  LABPROT 13.1  INR 1.0   Assessment/Plan: Contained diverticular abscess, improving. No abdominal pain.  Leukocytosis resolved (WBCs 9.7 today). Surgery and IR deferred intervention and recommend continuation of outpatient antibiotics.  ID consulted by hospital team who recommended Levaquin 750 mg daily and augmentin 875 mg bid (anaerobe/gram positive) Total 2 wks.   Patient advised to limit fiber intake.  Miralax can be used as needed for constipation.  Patient advised to contact Dr. Therisa Doyne [facility] GI) if any concerns develop, including abdominal pain or fever.  Follow-up will be arranged with out office.  Salley Slaughter PA-C 03/08/2020, 1:45 PM  Contact #  [phone]

## 2020-03-08 NOTE — Progress Notes (Signed)
Pharmacy Antibiotic Note  Harly Bandstra is a 74 y.o. female admitted on 03/05/2020 with intermittent abd cramping since March. Per CT 5/17: diverticulitis with contained microperforation --> worsening abscess/perforation.  Pharmacy has been consulted for Zosyn dosing.  03/08/2020: - Day#3 abx - Afebrile, WBC WNL - Scr WNL  Plan: Zosyn 3.375g IV Q8H infused over 4hrs. *Noted surgery recommends 14 days antibiotics.  Anticipate change to Augmentin when patient can tolerate.   No dose adjustments anticipated- Pharmacy will sign off. Please re-consult if needed.    Height: 5\' 2"  (157.5 cm) Weight: 60.8 kg (134 lb 0.6 oz) IBW/kg (Calculated) : 50.1  Temp (24hrs), Avg:97.9 F (36.6 C), Min:97.5 F (36.4 C), Max:98.5 F (36.9 C)  Recent Labs  Lab 03/05/20 1433 03/05/20 2109 03/06/20 0440 03/07/20 0422 03/08/20 0449  WBC 17.7* 18.3* 13.4* 12.3* 9.7  CREATININE 0.58 0.65 0.56 0.65 0.71    Estimated Creatinine Clearance: 52.2 mL/min (by C-G formula based on SCr of 0.71 mg/dL).    Allergies  Allergen Reactions  . Flagyl [Metronidazole] Hives     Thank you for allowing pharmacy to be a part of this patient's care.  Netta Cedars, PharmD, BCPS 03/08/2020, 10:28 AM

## 2020-03-08 NOTE — Care Management Important Message (Signed)
Important Message  Patient Details IM Letter given to [facility] Case Manager to present to the Patient Name: Olivia Gomez MRN: [id number] Date of Birth: 1945-11-27   Medicare Important Message Given:  Yes     Kerin Salen 03/08/2020, 11:22 AM

## 2020-03-13 ENCOUNTER — Other Ambulatory Visit: Payer: Self-pay | Admitting: Gastroenterology

## 2020-03-13 DIAGNOSIS — K5720 Diverticulitis of large intestine with perforation and abscess without bleeding: Secondary | ICD-10-CM | POA: Diagnosis not present

## 2020-03-13 DIAGNOSIS — R600 Localized edema: Secondary | ICD-10-CM | POA: Diagnosis not present

## 2020-03-27 ENCOUNTER — Ambulatory Visit
Admission: RE | Admit: 2020-03-27 | Discharge: 2020-03-27 | Disposition: A | Discharge status: Home / Self Care | Payer: Medicare HMO | Source: Ambulatory Visit | Attending: Gastroenterology | Admitting: Gastroenterology

## 2020-03-27 ENCOUNTER — Other Ambulatory Visit: Payer: Self-pay

## 2020-03-27 DIAGNOSIS — K5720 Diverticulitis of large intestine with perforation and abscess without bleeding: Secondary | ICD-10-CM

## 2020-03-27 DIAGNOSIS — K5732 Diverticulitis of large intestine without perforation or abscess without bleeding: Secondary | ICD-10-CM | POA: Diagnosis not present

## 2020-03-27 MED ORDER — IOPAMIDOL (ISOVUE-300) INJECTION 61%
100.0000 mL | Freq: Once | INTRAVENOUS | Status: AC | PRN
  Administered 2020-03-27: 100 mL via INTRAVENOUS

## 2020-04-19 DIAGNOSIS — K5720 Diverticulitis of large intestine with perforation and abscess without bleeding: Secondary | ICD-10-CM | POA: Diagnosis not present

## 2020-04-27 DIAGNOSIS — K5792 Diverticulitis of intestine, part unspecified, without perforation or abscess without bleeding: Secondary | ICD-10-CM | POA: Diagnosis not present

## 2020-05-07 ENCOUNTER — Ambulatory Visit: Payer: Self-pay | Admitting: Surgery

## 2020-05-07 NOTE — H&P (Deleted)
CC: Long-term f/u - underwent I&D of recurrent perirectal abscess 01/28/2019; perianal Crohn's disease  HPI: Intraoperative findings: Scarring/fibrosis in distal rectum evident on digital exam; no palpable masses per se. Left sided perirectal abscess - tracking anterior and posterior to anus - approximately 8 cm long. No communication could be identified with the anal canal/distal most rectum. Some purulent material present and sent for cultures. Granulation tissue present suggesting this has been a chronic collection. Given all findings, suspicious for underlying Crohn's disease  He is admitted postoperatively and discharged the following day. He is here today for follow-up. He has ongoing drainage and some associated discomfort with the wound which is currently healing. He denies any fever/chills. He has been keeping the area as clean as he can and covering with dry gauze to protect his underwear  He re-presented to the hospital 03/30/2019 with a possible recurrent perirectal abscess and underwent EUA with incision and drainage of left perirectal abscess. There was not a significant amount of pus drained and a Penrose drain was placed which was subsequently removed. He returned for follow-up after missing his appointments with us.  He has since establish care with Dr. Danis. There were issues with obtaining remicade infusion for his anorectal Crohn's disease but with significant help from community support networks he has got on therapy. He has noticed significant improvement in all of his symptoms overall since initiating therapy. He did develop some recurrent left buttock pain at the site of all of his disease after he fell on his bottom around the pool. Following this he reports some swelling and increased discomfort. He was seen in emergency department which showed a complex-appearing collection in the perianal skin that was incised with pus drainage. Since that, he has reported improvement.  There was a probable fistula seen on CT scan emanating from the perianal/perirectal space and terminating out where his abscess/wound burden has been. Currently stable/improved discomfort in this region. Denies fevers/chills. Drainage has been decreasing steadily.  PMH: No known medical hx  PSH: I&D of perirectal abscess 2016 - Dr. Tsuei  FHx: Denies FHx of malignancy  Social: Denies use of EtOH/drugs; Does use tobacco daily  ROS: A comprehensive 10 system review of systems was completed with the patient and pertinent findings as noted above.  The patient is a 74 year old female.   Allergies (Chanel Nolan, CMA; 04/27/2020 1:25 PM) No Known Drug Allergies  [05/24/2015]: Allergies Reconciled   Medication History (Chanel Nolan, CMA; 04/27/2020 1:26 PM) metroNIDAZOLE (250MG Tablet, Oral) Active. Tylenol (Oral) Specific strength unknown - Active. Medications Reconciled    Review of Systems (Bryton Romagnoli M. Haywood Meinders MD; 04/27/2020 1:56 PM) General Not Present- Chills and Fever. HEENT Not Present- Blurred Vision and Double Vision. Respiratory Not Present- Cough and Decreased Exercise Tolerance. Cardiovascular Not Present- Chest Pain, Difficulty Breathing Lying Down and Difficulty Breathing On Exertion. Gastrointestinal Not Present- Abdominal Pain, Nausea and Vomiting. Neurological Not Present- Decreased Memory, Fainting, Headaches, Numbness, Seizures, Tingling, Tremor and Weakness. Psychiatric Not Present- Anxiety and Depression. Endocrine Not Present- Appetite Changes and Cold Intolerance. Hematology Not Present- Abnormal Bleeding and Blood Clots.  Vitals (Chanel Nolan CMA; 04/27/2020 1:26 PM) 04/27/2020 1:26 PM Weight: 142.38 lb Height: 65in Body Surface Area: 1.71 m Body Mass Index: 23.69 kg/m  Temp.: 98.3F  Pulse: 88 (Regular)  BP: 132/82(Sitting, Left Arm, Standard)       Physical Exam (Emmajean Ratledge M. Sean Malinowski MD; 04/27/2020 1:57 PM) The physical exam findings are  as follows: Note: Gen: NAD, comfortable CV: RRR   Anorectal: 2 chronic appearing external openings on left buttock; R buttock now normal without open wounds or concerns - much improved. Opened wound L buttock at I&D site. Wound probed ~3 cm medially. No active pus. No erythema/fluctuance. Thick scarring consistent with multiple I&Ds and with what is now almost certainly perianal Crohn's.  Anoscopy/DRE deferred today at his request    Assessment & Plan (Clydie Dillen M. Suresh Audi MD; 04/27/2020 2:02 PM) TOBACCO ABUSE (Z72.0) CROHN DISEASE (K50.90) Story: Mr. Ksor is a very pleasant 48yoM now s/p I&D of perirectal abscess x3 - 1st in 2016; subsequent 01/2019 and 03/2019- no fistula found. Intraoperatively, found to have findings concerning for possible Crohn's disease - continues to appear like this but is responding quite well to Remicade with exception of 1 remaining active fistula that appears to be source of his most recent abscess- Left buttock  -Remicade appears be working nicely, however, one area of concern remains with a large fistulous-appearing tract on the CAT scan emanating to his left buttock. Given his recurrent abscesses in this location and the size of the fistula seen on the CAT scan, would likely be able to cannulate this with an anorectal exam under anesthesia and place a seton to hopefully help prevent further issues with regards to perianal abscess/sepsis. Impression: -With the assistance of a translator from [facility], we have again reviewed: the anatomy and physiology of the anal canal at length. The pathophysiology of perianal abscesses and fistula as they pertain to Crohn's disease was discussed at length as well. -Given recurrent abscesses on the left, we discussed proceeding with anorectal examination anesthesia, possible incision/drainage of perianal collection, placement of seton. We discussed that this would reduce the risk of recurrent abscesses but not limited them completely  particularly in the setting of Crohn's. -The planned procedure, material risks (including, but not limited to, pain, bleeding, infection, scarring, need for additional procedures, recurrence of abscess, new fistula, pneumonia, heart attack, stroke, death) benefits and alternatives to surgery were discussed at length. I noted a good probability that the procedure would help improve his symptoms. The patient's questions were answered to their satisfaction, they voiced understanding and elected to proceed with surgery. Additionally, we discussed typical postoperative expectations and the recovery process and the high probability that the seton remained in place indefinitely and expectations therein. -Smoking cessation again advised - he continues to smoke - we discussed Crohn's being much harder to control with active tobacco use.  This patient encounter took 26 minutes today to perform the following: take history, perform exam, review outside records, interpret imaging, counsel the patient on their diagnosis and document encounter, findings & plan in the EHR  Signed by Netanel Yannuzzi M Faythe Heitzenrater, MD (04/27/2020 2:02 PM) 

## 2020-05-07 NOTE — H&P (Signed)
CC: Referred by Dr. Therisa Gomez for hx of diverticulitis  HPI: Olivia Gomez is a pleasant 61yoF with hx of tobacco use, seborrheic dermatitis - presents to our office as a referral from Dr. Therisa Gomez for evaluation of diverticulitis, recently. Approximately 1 week ago she began having some sharp intermittent left lower quadrant abdominal pains. She denies fever or chills. She denies nausea or vomiting. She ultimately was seen in gastroenterology 3 days ago and underwent CT scan which demonstrated a markedly thickened segment of sigmoid. It was an adjacent irregular 4.5 cm fluid collection and "possible" extraluminal locules of gas. Differential including perisigmoidal abscess with microperforation or neoplasm. She was started on Augmentin. She reports dramatic improvement since beginning this and currently has no abdominal pain whatsoever. She continues to deny any fever/chills/nausea/vomiting. She is having bowel movements. She denies abdominal cramping or distention. She denies ever having had this before. She believes her last colonoscopy was about 5 or 7 years ago and believes a polyp was removed. We do not have a copy of this.  INTERVAL HX She has been following with Dr. Therisa Gomez and has had serial surveillance CAT scans performed of the area of concern which have shown stable findings. She has been noted to have fullness of the left renal collecting system and left ureter related to inflammatory change in her sigmoid colon. GI has not felt comfortable performing an endoscopic evaluation of her colon given the persistent findings. She was admitted to the hospital because of her imaging findings back in May but had no abdominal pain at that time nor fever/chills/nausea/vomiting. She was discharged home. She had a follow-up CT scan 03/27/20 that showed stable findings. There is an extraluminal air-fluid collection in its 4.7 x 3 cm along the sigmoid colon that appears most consistent with a  "diverticulum"/previously sealed perforation. She denies any complaints today. She denies any abdominal pain. She denies any fever/chills/nausea/vomiting. She's been having soft formed stool. She does take MiraLAX.  PMH: Tobacco use (uncontrolled); seborrheic dermatitis (well controlled with shampoo)  PSH: Open appendectomy as a child. She denies any other abdominal surgical history.  FHx: Denies FHx of malignancy  Social: Active tobacco use-one pack per day. She reports she drinks about 2 glasses of red wine per night. She denies illicit drug use. 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  The patient is a 74 year old female.   Allergies (Olivia Gomez, CMA; 04/27/2020 11:14 AM) Flagyl *ANTI-INFECTIVE AGENTS - MISC.*  Hives. Allergies Reconciled   Medication History (Olivia Gomez, CMA; 04/27/2020 11:15 AM) Aspirin (81MG  Tablet DR, Oral) Active. Co Q10 (100MG  Capsule, Oral) Active. Cod Liver Oil (Oral) Specific strength unknown - Active. Fish Oil (1000MG  Capsule, Oral) Active. Magnesium (250MG  Tablet, Oral) Active. Turmeric (500MG  Capsule, Oral) Active. Vitamin B Complex (Oral) Active. Vitamin C (1000MG  Tablet Chewable, Oral) Active. Vitamin D (50 MCG(2000 UT) Capsule, Oral) Active. Florastor (Oral) Specific strength unknown - Active. Medications Reconciled    Review of Systems Olivia Gave M. Ladine Kiper MD; 04/27/2020 12:07 PM) General Present- Night Sweats. Not Present- Appetite Loss, Chills, Fatigue, Fever, Weight Gain and Weight Loss. HEENT Present- Hearing Loss. Not Present- Earache, Hoarseness, Nose Bleed, Oral Ulcers, Ringing in the Ears, Seasonal Allergies, Sinus Pain, Sore Throat, Visual Disturbances, Wears glasses/contact lenses and Yellow Eyes. Respiratory Not  Present- Bloody  sputum, Chronic Cough, Difficulty Breathing, Snoring and Wheezing. Cardiovascular Not Present- Chest Pain, Difficulty Breathing Lying Down, Leg Cramps, Palpitations, Rapid Heart Rate, Shortness of Breath and Swelling of Extremities. Gastrointestinal Present- Excessive gas. Not Present- Abdominal Pain, Bloating, Bloody Stool, Change in Bowel Habits, Chronic diarrhea, Constipation, Difficulty Swallowing, Gets full quickly at meals, Hemorrhoids, Indigestion, Nausea, Rectal Pain and Vomiting. Musculoskeletal Not Present- Back Pain, Joint Pain, Joint Stiffness, Muscle Pain, Muscle Weakness and Swelling of Extremities. Neurological Not Present- Decreased Memory, Fainting, Headaches, Numbness, Seizures, Tingling, Tremor, Trouble walking and Weakness. Endocrine Not Present- Appetite Changes and Cold Intolerance. Hematology Not Present- Abnormal Bleeding, Blood Clots and Blood Thinners.  Vitals (Olivia Gomez CMA; 04/27/2020 11:15 AM) 04/27/2020 11:15 AM Weight: 102.13 lb Height: 62in Body Surface Area: 1.44 m Body Mass Index: 18.68 kg/m  Temp.: 96.41F  Pulse: 81 (Regular)  BP: 122/72(Sitting, Left Arm, Standard)       Physical Exam Olivia Gave M. Irean Kendricks MD; 04/27/2020 12:07 PM) The physical exam findings are as follows: Note: Constitutional: No acute distress; conversant; no deformities; wearing mask Eyes: Moist conjunctiva; no lid lag; anicteric sclerae; pupils equal and round Neck: Trachea midline; no palpable thyromegaly Lungs: Normal respiratory effort; no tactile fremitus CV: rrr; no palpable thrill; no pitting edema GI: Abdomen soft, nontender, nondistended; no palpable hepatosplenomegaly MSK: Normal gait; no clubbing/cyanosis Psychiatric: Appropriate affect; alert and oriented 3 Lymphatic: No palpable cervical or axillary lymphadenopathy    Assessment & Plan Olivia Gave M. Breyon Sigg MD; 04/27/2020 12:16 PM) DIVERTICULITIS (K57.92) Story: Olivia Gomez is a pleasant  53yoF with hx of tobacco use, seborrheic dermatitis - here for follow-up evaluation of ?chronic diverticulitis - with adjacent collection, 4.5 cm. She has had multiple CT scans over last 4 months demonstrating stability but persistence of findings; no major symptoms but has had some weight loss  -The anatomy and physiology of the GI tract was discussed at length with the patient. The pathophysiology of diverticulitis was discussed at length with associated pictures. We discussed potential for underlying malignancy as well but stability on imaging and appearance thus far hasn't suggested neoplasm. GI does not feel comfortable with scope at this point with imaging findings so cannot say for sure without endoscopic evaluation but this is the best we can do at this point Impression: -We reviewed options including further observation vs surgery - she would like to pursue surgery at this point given persistent findings -We discussed robotic and potential open sigmoidectomy, flexible sigmoidoscopy, cystoscopy/ureteral stents + firefly ([facility]); scenarios where an ostomy may be necessary as well -The planned procedures, material risks (including, but not limited to, pain, bleeding, infection, scarring, need for blood transfusion, damage to surrounding structures- blood vessels/nerves/viscus/organs, damage to ureter specifically increased in her case as well given imaging findings, urine leak, leak from anastomosis, need for additional procedures, worsening of pre-existing medical conditions, need for stoma which may be permanent, hernia, recurrence of diverticulitis although lower than without surgery, DVT/PE, pneumonia, heart attack, stroke, death) benefits and alternatives to surgery were discussed at length. The patient's questions were answered to her satisfaction, she voiced understanding and elected to proceed with surgery. Additionally, we discussed typical postoperative expectations and the recovery  process at length as well -We discussed importance of smoking cessation - she has dramatically reduced amount to 1-2 cigarettes per day but we discussed complete cessation immediately... we also discussed her risk for infectious complications including anastomotic leaks in smokers and increased risk of requiring ostomy   This patient encounter took 50 minutes  today to perform the following: take history, perform exam, review outside records, interpret imaging, counsel the patient on their diagnosis and document encounter, findings & plan in the EHR  Signed by Ileana Roup, MD (04/27/2020 12:16 PM)

## 2020-05-07 NOTE — H&P (Signed)
CC: Referred by Dr. Therisa Gomez for hx of diverticulitis  HPI: Olivia Gomez is a pleasant 56yoF with hx of tobacco use, seborrheic dermatitis - presents to our office as a referral from Dr. Therisa Gomez for evaluation of diverticulitis, recently. Approximately 1 week ago she began having some sharp intermittent left lower quadrant abdominal pains. She denies fever or chills. She denies nausea or vomiting. She ultimately was seen in gastroenterology 3 days ago and underwent CT scan which demonstrated a markedly thickened segment of sigmoid. It was an adjacent irregular 4.5 cm fluid collection and "possible" extraluminal locules of gas. Differential including perisigmoidal abscess with microperforation or neoplasm. She was started on Augmentin. She reports dramatic improvement since beginning this and currently has no abdominal pain whatsoever. She continues to deny any fever/chills/nausea/vomiting. She is having bowel movements. She denies abdominal cramping or distention. She denies ever having had this before. She believes her last colonoscopy was about 5 or 7 years ago and believes a polyp was removed. We do not have a copy of this.  PMH: Tobacco use (uncontrolled); seborrheic dermatitis (well controlled with shampoo)  PSH: Open appendectomy as a child. She denies any other abdominal surgical history.  FHx: Denies FHx of malignancy  Social: Active tobacco use-one pack per day. She reports she drinks about 2 glasses of red wine per night. She denies illicit drug use. She reports that she previously worked as a Olivia Gomez that has been out of work since 2008 due to job market  ROS: A comprehensive 10 system review of systems was completed with the patient and pertinent findings as noted above.  The patient is a 74 year old female.   Past Surgical History Olivia Gomez, RMA; 02/02/2020 9:14 AM) Appendectomy  Oral Surgery   Diagnostic Studies History Olivia Gomez,  RMA; 02/02/2020 9:14 AM) Colonoscopy  1-5 years ago Pap Smear  >5 years ago  Allergies Olivia Gomez, RMA; 02/02/2020 9:14 AM) Flagyl *ANTI-INFECTIVE AGENTS - MISC.*  Hives. Allergies Reconciled   Medication History Olivia Gomez, RMA; 02/02/2020 9:17 AM) Amoxicillin-Pot Clavulanate (500-125MG  Tablet, Oral) Active. Aspirin (81MG  Tablet DR, Oral) Active. Co Q10 (100MG  Capsule, Oral) Active. Cod Liver Oil (Oral) Specific strength unknown - Active. Fish Oil (1000MG  Capsule, Oral) Active. Magnesium (250MG  Tablet, Oral) Active. Turmeric (500MG  Capsule, Oral) Active. Vitamin B Complex (Oral) Active. Vitamin C (1000MG  Tablet Chewable, Oral) Active. Vitamin D (50 MCG(2000 UT) Capsule, Oral) Active. Medications Reconciled  Social History Olivia Gomez, RMA; 02/02/2020 9:14 AM) Alcohol use  Moderate alcohol use. Caffeine use  Coffee. Tobacco use  Current every day smoker.  Family History Olivia Gomez, RMA; 02/02/2020 9:14 AM) Alcohol Abuse  Mother. Cerebrovascular Accident  Mother. Diabetes Mellitus  Brother. Heart Disease  Father.  Pregnancy / Birth History Olivia Bers Sunol, RMA; 02/02/2020 9:14 AM) Age of men[age]  18-58 Gravida  0 Para  0    Review of Systems Olivia Gave M. Wynnie Pacetti MD; 02/02/2020 9:31 AM) General Present- Night Sweats. Not Present- Appetite Loss, Chills, Fatigue, Fever, Weight Gain and Weight Loss. HEENT Present- Hearing Loss. Not Present- Earache, Hoarseness, Nose Bleed, Oral Ulcers, Ringing in the Ears, Seasonal Allergies, Sinus Pain, Sore Throat, Visual Disturbances, Wears glasses/contact lenses and Yellow Eyes. Respiratory Not Present- Bloody sputum, Chronic Cough, Difficulty Breathing, Snoring and Wheezing. Cardiovascular Not Present- Chest Pain, Difficulty Breathing Lying Down, Leg Cramps, Palpitations, Rapid Heart Rate, Shortness of Breath and Swelling of Extremities. Gastrointestinal Present- Excessive gas.  Not Present- Abdominal Pain, Bloating, Bloody Stool, Change in Bowel Habits, Chronic diarrhea, Constipation,  Difficulty Swallowing, Gets full quickly at meals, Hemorrhoids, Indigestion, Nausea, Rectal Pain and Vomiting. Musculoskeletal Not Present- Back Pain, Joint Pain, Joint Stiffness, Muscle Pain, Muscle Weakness and Swelling of Extremities. Neurological Not Present- Decreased Memory, Fainting, Headaches, Numbness, Seizures, Tingling, Tremor, Trouble walking and Weakness. Endocrine Not Present- Appetite Changes and Cold Intolerance. Hematology Not Present- Abnormal Bleeding, Blood Clots and Blood Thinners.  Vitals Olivia Gomez RMA; 02/02/2020 9:18 AM) 02/02/2020 9:17 AM Weight: 110.2 lb Height: 62in Body Surface Area: 1.48 m Body Mass Index: 20.16 kg/m  Temp.: 98.49F (Temporal)  Pulse: 81 (Regular)  P.OX: 97% (Room air) BP: 132/70(Sitting, Left Arm, Standard)       Physical Exam Olivia Gave M. Ron Junco MD; 02/02/2020 9:31 AM) The physical exam findings are as follows: Note: Constitutional: No acute distress; conversant; no deformities; wearing mask Eyes: Moist conjunctiva; no lid lag; anicteric sclerae; pupils equal and round Neck: Trachea midline; no palpable thyromegaly Lungs: Normal respiratory effort; no tactile fremitus CV: rrr; no palpable thrill; no pitting edema GI: Abdomen soft, nontender, nondistended; no palpable hepatosplenomegaly MSK: Normal gait; no clubbing/cyanosis Psychiatric: Appropriate affect; alert and oriented 3 Lymphatic: No palpable cervical or axillary lymphadenopathy    Assessment & Plan Olivia Gave M. Carrianne Hyun MD; 02/02/2020 9:36 AM) DIVERTICULITIS (K57.92) Story: Ms. Olivia Gomez is a pleasant 12yoF with hx of tobacco use, seborrheic dermatitis - here for evaluation of 1st episode of diverticulitis - with adjacent collection, 4.5 cm. That said, she was had dramatic improvement on oral antibiotics and is currently symptom/pain-free. She has no  fevers. She denies any abdominal pain. Impression: -Given her relatively dramatic improvement following about excellent, think it is reasonable to continue this course. -She will likely require her colonoscopy to be updated once her diverticulitis is resolved -We discussed starting a fiber supplement once she completes her antibiotic course -We also discussed the importance of tobacco cessation- would not advocate for "risk reduction" surgery is she is actively using nicotine-containing products. We discussed specifically in having these etiologies pertains to the GI tract and pathophysiology of diverticulitis. We also discussed increased risk of perioperative complications including infectious, hernias as well as anastomotic/leak in the setting of active tobacco use and importance of quitting -Will plan to see her back after she completes antibiotics and colonoscopy for further follow-up and discussion  This patient encounter took 48 minutes today to perform the following: take history, perform exam, review outside records, interpret imaging, counsel the patient on their diagnosis and document encounter, findings & plan in the EHR  Signed by Ileana Roup, MD (02/02/2020 9:36 AM)

## 2020-05-23 NOTE — Patient Instructions (Addendum)
DUE TO COVID-19 ONLY ONE VISITOR IS ALLOWED TO COME WITH YOU AND STAY IN THE WAITING ROOM ONLY DURING PRE OP AND PROCEDURE DAY OF SURGERY. THE 2 VISITORS MAY VISIT WITH YOU AFTER SURGERY IN YOUR PRIVATE ROOM DURING VISITING HOURS ONLY!  YOU NEED TO HAVE A COVID 19 TEST ON    8/12__ @__2 :55___, THIS TEST MUST BE DONE BEFORE SURGERY,  COVID TESTING SITE [address] [place] [address], IT IS ON THE RIGHT GOING OUT [address] APPROXITAMELTELY 2 MINUTES PAST [employer] ON THE RIGHT. ONCE YOUR COVID TEST IS COMPLETED,  PLEASE BEGIN THE QUARANTINE INSTRUCTIONS AS OUTLINED IN YOUR HANDOUT.                Olivia Gomez    Your procedure is scheduled on: 06/04/20   Report to [facility] Main  Entrance   Report to admitting at   11:30 AM     Call this number if you have problems the morning of surgery [place], NO CHEWING GUM [place].  Follow all instructions for bowel prep.  Drink plenty of fluids the day of your prep   DRINK 2 PRESURGERY ENSURE DRINKS THE NIGHT BEFORE SURGERY AT 10:00 PM    NO SOLIDS AFTER MIDNIGHT THE DAY PRIOR TO THE SURGERY.   NOTHING BY MOUTH EXCEPT CLEAR LIQUIDS UNTIL 10:30 AM     CLEAR LIQUID DIET   Foods Allowed                                                                     Foods Excluded  Coffee and tea, regular and decaf                             liquids that you cannot  Plain Jell-O any favor except red or purple                                           see through such as: Fruit ices (not with fruit pulp)                                     milk, soups, orange juice  Iced Popsicles                                    All solid food Carbonated beverages, regular and diet                                    Cranberry, grape and apple juices Sports drinks like Gatorade Lightly seasoned clear broth or consume(fat free) Sugar, honey  syrup   PLEASE FINISH PRESURGERY ENSURE DRINK PER SURGEON ORDER 3 HOURS PRIOR TO SCHEDULED SURGERY TIME WHICH NEEDS TO BE COMPLETED AT _10:30 AM________.    Take  these medicines the morning of surgery with A SIP OF WATER:   None                                 You may not have any metal on your body including hair pins and              piercings  Do not wear jewelry, make-up, lotions, powders or perfumes, deodorant             Do not wear nail polish on your fingernails.  Do not shave  48 hours prior to surgery.              Do not bring valuables to the hospital. [place].  Contacts, dentures or bridgework may not be worn into surgery.       Name and phone number of your driver:  Special Instructions: N/A              Please read over the following fact sheets you were given: _____________________________________________________________________             [facility] - Preparing for Surgery Before surgery, you can play an important role.   Because skin is not sterile, your skin needs to be as free of germs as possible.   You can reduce the number of germs on your skin by washing with CHG (chlorahexidine gluconate) soap before surgery.   CHG is an antiseptic cleaner which kills germs and bonds with the skin to continue killing germs even after washing. Please DO NOT use if you have an allergy to CHG or antibacterial soaps.   If your skin becomes reddened/irritated stop using the CHG and inform your nurse when you arrive at Short Stay. Do not shave (including legs and underarms) for at least 48 hours prior to the first CHG shower.   Please follow these instructions carefully:  1.  Shower with CHG Soap the night before surgery and the  morning of Surgery.  2.  If you choose to wash your hair, wash your hair first as usual with your  normal  shampoo.  3.  After you shampoo, rinse your hair and body thoroughly to remove the   shampoo.                                        4.  Use CHG as you would any other liquid soap.  You can apply chg directly  to the skin and wash                       Gently with a scrungie or clean washcloth.  5.  Apply the CHG Soap to your body ONLY FROM THE NECK DOWN.   Do not use on face/ open                           Wound or open sores. Avoid contact with eyes, ears mouth and genitals (private parts).                       Wash face,  Genitals (private parts) with your normal soap.  6.  Wash thoroughly, paying special attention to the area where your surgery  will be performed.  7.  Thoroughly rinse your body with warm water from the neck down.  8.  DO NOT shower/wash with your normal soap after using and rinsing off  the CHG Soap.             9.  Pat yourself dry with a clean towel.            10.  Wear clean pajamas.            11.  Place clean sheets on your bed the night of your first shower and do not  sleep with pets. Day of Surgery : Do not apply any lotions/deodorants the morning of surgery.  Please wear clean clothes to the hospital/surgery center.  FAILURE TO FOLLOW THESE INSTRUCTIONS MAY RESULT IN THE CANCELLATION OF YOUR SURGERY PATIENT SIGNATURE_________________________________  NURSE SIGNATURE__________________________________  ________________________________________________________________________   Olivia Gomez  An incentive spirometer is a tool that can help keep your lungs clear and active. This tool measures how well you are filling your lungs with each breath. Taking long deep breaths may help reverse or decrease the chance of developing breathing (pulmonary) problems (especially infection) following:  A long period of time when you are unable to move or be active. BEFORE THE PROCEDURE   If the spirometer includes an indicator to show your best effort, your nurse or respiratory therapist will set it to a desired goal.  If possible, sit  up straight or lean slightly forward. Try not to slouch.  Hold the incentive spirometer in an upright position. INSTRUCTIONS FOR USE  1. Sit on the edge of your bed if possible, or sit up as far as you can in bed or on a chair. 2. Hold the incentive spirometer in an upright position. 3. Breathe out normally. 4. Place the mouthpiece in your mouth and seal your lips tightly around it. 5. Breathe in slowly and as deeply as possible, raising the piston or the ball toward the top of the column. 6. Hold your breath for 3-5 seconds or for as long as possible. Allow the piston or ball to fall to the bottom of the column. 7. Remove the mouthpiece from your mouth and breathe out normally. 8. Rest for a few seconds and repeat Steps 1 through 7 at least 10 times every 1-2 hours when you are awake. Take your time and take a few normal breaths between deep breaths. 9. The spirometer may include an indicator to show your best effort. Use the indicator as a goal to work toward during each repetition. 10. After each set of 10 deep breaths, practice coughing to be sure your lungs are clear. If you have an incision (the cut made at the time of surgery), support your incision when coughing by placing a pillow or rolled up towels firmly against it. Once you are able to get out of bed, walk around indoors and cough well. You may stop using the incentive spirometer when instructed by your caregiver.  RISKS AND COMPLICATIONS  Take your time so you do not get dizzy or light-headed.  If you are in pain, you may need to take or ask for pain medication before doing incentive spirometry. It is harder to take a deep breath if you are having pain. AFTER USE  Rest and breathe slowly and easily.  It can be helpful to keep track of a log of your progress. Your caregiver  can provide you with a simple table to help with this. If you are using the spirometer at home, follow these instructions: [place] IF:   You are  having difficultly using the spirometer.  You have trouble using the spirometer as often as instructed.  Your pain medication is not giving enough relief while using the spirometer.  You develop fever of 100.5 F (38.1 C) or higher. SEEK IMMEDIATE MEDICAL CARE IF:   You cough up bloody sputum that had not been present before.  You develop fever of 102 F (38.9 C) or greater.  You develop worsening pain at or near the incision site. MAKE SURE YOU:   Understand these instructions.  Will watch your condition.  Will get help right away if you are not doing well or get worse. Document Released: 02/16/2007 Document Revised: 12/29/2011 Document Reviewed: 04/19/2007 [facility] Patient Information 2014 [place], [place].   ________________________________________________________________________

## 2020-05-25 ENCOUNTER — Other Ambulatory Visit: Payer: Self-pay

## 2020-05-25 ENCOUNTER — Encounter (HOSPITAL_COMMUNITY): Payer: Self-pay

## 2020-05-25 ENCOUNTER — Encounter (HOSPITAL_COMMUNITY)
Admission: RE | Admit: 2020-05-25 | Discharge: 2020-05-25 | Disposition: A | Discharge status: Home / Self Care | Payer: Medicare HMO | Source: Ambulatory Visit | Attending: Surgery | Admitting: Surgery

## 2020-05-25 DIAGNOSIS — Z01812 Encounter for preprocedural laboratory examination: Secondary | ICD-10-CM | POA: Diagnosis not present

## 2020-05-25 HISTORY — DX: Malignant (primary) neoplasm, unspecified: C80.1

## 2020-05-25 LAB — CBC WITH DIFFERENTIAL/PLATELET
Abs Immature Granulocytes: 0.02 10*3/uL (ref 0.00–0.07)
Basophils Absolute: 0.1 10*3/uL (ref 0.0–0.1)
Basophils Relative: 1 %
Eosinophils Absolute: 0.1 10*3/uL (ref 0.0–0.5)
Eosinophils Relative: 1 %
HCT: 40.9 % (ref 36.0–46.0)
Hemoglobin: 13.1 g/dL (ref 12.0–15.0)
Immature Granulocytes: 0 %
Lymphocytes Relative: 21 %
Lymphs Abs: 1.5 10*3/uL (ref 0.7–4.0)
MCH: 30 pg (ref 26.0–34.0)
MCHC: 32 g/dL (ref 30.0–36.0)
MCV: 93.8 fL (ref 80.0–100.0)
Monocytes Absolute: 0.6 10*3/uL (ref 0.1–1.0)
Monocytes Relative: 8 %
Neutro Abs: 4.9 10*3/uL (ref 1.7–7.7)
Neutrophils Relative %: 69 %
Platelets: 272 10*3/uL (ref 150–400)
RBC: 4.36 MIL/uL (ref 3.87–5.11)
RDW: 14.9 % (ref 11.5–15.5)
WBC: 7.2 10*3/uL (ref 4.0–10.5)
nRBC: 0 % (ref 0.0–0.2)

## 2020-05-25 LAB — COMPREHENSIVE METABOLIC PANEL
ALT: 15 U/L (ref 0–44)
AST: 22 U/L (ref 15–41)
Albumin: 4.3 g/dL (ref 3.5–5.0)
Alkaline Phosphatase: 50 U/L (ref 38–126)
Anion gap: 8 (ref 5–15)
BUN: 15 mg/dL (ref 8–23)
CO2: 27 mmol/L (ref 22–32)
Calcium: 9.7 mg/dL (ref 8.9–10.3)
Chloride: 104 mmol/L (ref 98–111)
Creatinine, Ser: 0.84 mg/dL (ref 0.44–1.00)
GFR calc Af Amer: >60 mL/min (ref >60)
GFR calc non Af Amer: >60 mL/min (ref >60)
Glucose, Bld: 93 mg/dL (ref 70–99)
Potassium: 4.5 mmol/L (ref 3.5–5.1)
Sodium: 139 mmol/L (ref 135–145)
Total Bilirubin: 0.4 mg/dL (ref 0.3–1.2)
Total Protein: 7 g/dL (ref 6.5–8.1)

## 2020-05-25 LAB — HEMOGLOBIN A1C
Hgb A1c MFr Bld: 5.2 % (ref 4.8–5.6)
Mean Plasma Glucose: 102.54 mg/dL

## 2020-05-25 LAB — PROTIME-INR
INR: 0.9 (ref 0.8–1.2)
Prothrombin Time: 12.2 seconds (ref 11.4–15.2)

## 2020-05-25 LAB — APTT: aPTT: 33 seconds (ref 24–36)

## 2020-05-25 NOTE — Consult Note (Addendum)
[place] Nurse requested for preoperative stoma site marking  Discussed surgical procedure and stoma creation with patient.  Explained role of the [place] nurse team.  Provided the patient with educational booklet and provided samples of pouching options.  Answered patient's questions.   Examined patient sitting, and standing in order to place the marking in the patient's visual field, away from any creases or abdominal contour issues and within the rectus muscle.  Attempted to mark below the patient's belt line. There is a significant skin fold which occurs lower on the abd when the patient leans forward and should be avoided if possible.   Marked for colostomy in the LLQ  _6___ cm to the left of the umbilicus and __even with the umbilicus.  Marked for ileostomy in the RLQ  __6__cm to the right of the umbilicus and  _even__with the umbilicus.  Patient's abdomen cleansed with CHG wipes at site markings, allowed to air dry prior to marking. Covered mark with thin film transparent dressing to preserve mark until date of surgery. Provided pt with a marking pen and instructed to recolor in if it begins to fade prior to surgery.   [place] Nurse team will follow up with patient after surgery for continued ostomy care and teaching if she receives an ostomy.  Julien Girt MSN, RN, [place], [place], [place]

## 2020-05-25 NOTE — Progress Notes (Signed)
COVID Vaccine Completed:yes Date COVID Vaccine completed:12/28/19 COVID vaccine manufacturer: [place]      PCP - [facility] PA Cardiologist - no  Chest x-ray - no EKG - no Stress Test - no ECHO - no Cardiac Cath - no  Sleep Study - no CPAP -   Fasting Blood Sugar -  NA Checks Blood Sugar _____ times a day  Blood Thinner Instructions:ASA/ [place] PA Aspirin Instructions:none . Pt was told to call Dr. Dema Severin Last Dose:05/28/20  Anesthesia review:   Patient denies shortness of breath, fever, cough and chest pain at PAT appointment Yes  Patient verbalized understanding of instructions that were given to them at the PAT appointment. 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 She is very concerned about her  nutrition and the weight that she has loss.

## 2020-05-31 ENCOUNTER — Other Ambulatory Visit (HOSPITAL_COMMUNITY)
Admission: RE | Admit: 2020-05-31 | Discharge: 2020-05-31 | Disposition: A | Discharge status: Home / Self Care | Payer: Medicare HMO | Source: Ambulatory Visit | Attending: Surgery | Admitting: Surgery

## 2020-05-31 DIAGNOSIS — Z01812 Encounter for preprocedural laboratory examination: Secondary | ICD-10-CM | POA: Diagnosis not present

## 2020-05-31 DIAGNOSIS — Z20822 Contact with and (suspected) exposure to covid-19: Secondary | ICD-10-CM | POA: Insufficient documentation

## 2020-05-31 LAB — SARS CORONAVIRUS 2 (TAT 6-24 HRS): SARS Coronavirus 2: NEGATIVE

## 2020-06-03 MED ORDER — BUPIVACAINE LIPOSOME 1.3 % IJ SUSP
20.0000 mL | Freq: Once | INTRAMUSCULAR | Status: DC
  Filled 2020-06-03: qty 20

## 2020-06-04 ENCOUNTER — Encounter (HOSPITAL_COMMUNITY)
Admission: RE | Disposition: A | Discharge status: Home / Self Care | Payer: Self-pay | Source: Ambulatory Visit | Attending: Surgery

## 2020-06-04 ENCOUNTER — Ambulatory Visit (HOSPITAL_COMMUNITY)
Admission: RE | Admit: 2020-06-04 | Discharge: 2020-06-04 | Disposition: A | Discharge status: Home / Self Care | Payer: Medicare HMO | Source: Ambulatory Visit | Attending: Surgery | Admitting: Surgery

## 2020-06-04 ENCOUNTER — Inpatient Hospital Stay (HOSPITAL_COMMUNITY): Payer: Medicare HMO | Admitting: Registered Nurse

## 2020-06-04 ENCOUNTER — Other Ambulatory Visit: Payer: Self-pay

## 2020-06-04 ENCOUNTER — Encounter (HOSPITAL_COMMUNITY): Payer: Self-pay | Admitting: Surgery

## 2020-06-04 DIAGNOSIS — Z538 Procedure and treatment not carried out for other reasons: Secondary | ICD-10-CM | POA: Insufficient documentation

## 2020-06-04 DIAGNOSIS — K5792 Diverticulitis of intestine, part unspecified, without perforation or abscess without bleeding: Secondary | ICD-10-CM | POA: Insufficient documentation

## 2020-06-04 LAB — TYPE AND SCREEN
ABO/RH(D): O POS
Antibody Screen: NEGATIVE

## 2020-06-04 LAB — ABO/RH: ABO/RH(D): O POS

## 2020-06-04 SURGERY — COLECTOMY, SIGMOID, ROBOT-ASSISTED
Anesthesia: General

## 2020-06-04 MED ORDER — CHLORHEXIDINE GLUCONATE CLOTH 2 % EX PADS: 6.0000 | MEDICATED_PAD | Freq: Once | CUTANEOUS | Status: DC

## 2020-06-04 MED ORDER — LIDOCAINE HCL 2 % IJ SOLN
INTRAMUSCULAR | Status: AC
  Filled 2020-06-04: qty 20

## 2020-06-04 MED ORDER — POLYETHYLENE GLYCOL 3350 17 GM/SCOOP PO POWD
1.0000 | Freq: Once | ORAL | Status: DC
  Filled 2020-06-04: qty 255

## 2020-06-04 MED ORDER — ORAL CARE MOUTH RINSE: 15.0000 mL | Freq: Once | OROMUCOSAL | Status: AC

## 2020-06-04 MED ORDER — NEOMYCIN SULFATE 500 MG PO TABS: 1000.0000 mg | ORAL_TABLET | ORAL | Status: DC

## 2020-06-04 MED ORDER — LACTATED RINGERS IV SOLN: INTRAVENOUS | Status: DC

## 2020-06-04 MED ORDER — HEPARIN SODIUM (PORCINE) 5000 UNIT/ML IJ SOLN
5000.0000 [IU] | Freq: Once | INTRAMUSCULAR | Status: AC
  Administered 2020-06-04: 5000 [IU] via SUBCUTANEOUS
  Filled 2020-06-04: qty 1

## 2020-06-04 MED ORDER — ONDANSETRON HCL 4 MG/2ML IJ SOLN
INTRAMUSCULAR | Status: AC
  Filled 2020-06-04: qty 2

## 2020-06-04 MED ORDER — FENTANYL CITRATE (PF) 250 MCG/5ML IJ SOLN
INTRAMUSCULAR | Status: AC
  Filled 2020-06-04: qty 5

## 2020-06-04 MED ORDER — ROCURONIUM BROMIDE 10 MG/ML (PF) SYRINGE
PREFILLED_SYRINGE | INTRAVENOUS | Status: AC
  Filled 2020-06-04: qty 10

## 2020-06-04 MED ORDER — DEXAMETHASONE SODIUM PHOSPHATE 10 MG/ML IJ SOLN
INTRAMUSCULAR | Status: AC
  Filled 2020-06-04: qty 1

## 2020-06-04 MED ORDER — ENSURE PRE-SURGERY PO LIQD
296.0000 mL | Freq: Once | ORAL | Status: DC
  Filled 2020-06-04: qty 296

## 2020-06-04 MED ORDER — BISACODYL 5 MG PO TBEC: 20.0000 mg | DELAYED_RELEASE_TABLET | Freq: Once | ORAL | Status: DC

## 2020-06-04 MED ORDER — LIDOCAINE 2% (20 MG/ML) 5 ML SYRINGE
INTRAMUSCULAR | Status: AC
  Filled 2020-06-04: qty 5

## 2020-06-04 MED ORDER — ENSURE PRE-SURGERY PO LIQD
592.0000 mL | Freq: Once | ORAL | Status: DC
  Filled 2020-06-04: qty 592

## 2020-06-04 MED ORDER — ACETAMINOPHEN 500 MG PO TABS
1000.0000 mg | ORAL_TABLET | ORAL | Status: AC
  Administered 2020-06-04: 1000 mg via ORAL
  Filled 2020-06-04: qty 2

## 2020-06-04 MED ORDER — CHLORHEXIDINE GLUCONATE 0.12 % MT SOLN
15.0000 mL | Freq: Once | OROMUCOSAL | Status: AC
  Administered 2020-06-04: 15 mL via OROMUCOSAL

## 2020-06-04 MED ORDER — METRONIDAZOLE 500 MG PO TABS: 1000.0000 mg | ORAL_TABLET | ORAL | Status: DC

## 2020-06-04 MED ORDER — ALVIMOPAN 12 MG PO CAPS
12.0000 mg | ORAL_CAPSULE | ORAL | Status: AC
  Administered 2020-06-04: 12 mg via ORAL
  Filled 2020-06-04: qty 1

## 2020-06-04 MED ORDER — SODIUM CHLORIDE 0.9 % IV SOLN
2.0000 g | INTRAVENOUS | Status: DC
  Filled 2020-06-04: qty 2

## 2020-06-04 NOTE — Anesthesia Preprocedure Evaluation (Addendum)
Anesthesia Evaluation  Patient identified by MRN, date of birth, ID band Patient awake    Reviewed: Allergy & Precautions, H&P , NPO status , Patient's Chart, lab work & pertinent test results  Airway Mallampati: II  TM Distance: >3 FB Neck ROM: Full    Dental no notable dental hx. (+) Teeth Intact, Dental Advisory Given   Pulmonary COPD, Current Smoker and Patient abstained from smoking.,    Pulmonary exam normal breath sounds clear to auscultation       Cardiovascular negative cardio ROS   Rhythm:Regular Rate:Normal     Neuro/Psych negative neurological ROS  negative psych ROS   GI/Hepatic negative GI ROS, Neg liver ROS,   Endo/Other  negative endocrine ROS  Renal/GU negative Renal ROS  negative genitourinary   Musculoskeletal   Abdominal   Peds  Hematology negative hematology ROS (+)   Anesthesia Other Findings   Reproductive/Obstetrics negative OB ROS                            Anesthesia Physical Anesthesia Plan  ASA: II  Anesthesia Plan: General   Post-op Pain Management:    Induction: Intravenous  PONV Risk Score and Plan: 3 and Ondansetron, Dexamethasone and Treatment may vary due to age or medical condition  Airway Management Planned: Oral ETT  Additional Equipment:   Intra-op Plan:   Post-operative Plan: Extubation in OR  Informed Consent: I have reviewed the patients History and Physical, chart, labs and discussed the procedure including the risks, benefits and alternatives for the proposed anesthesia with the patient or authorized representative who has indicated his/her understanding and acceptance.     Dental advisory given  Plan Discussed with: CRNA  Anesthesia Plan Comments: (Case cancelled per surgeon)       Anesthesia Quick Evaluation

## 2020-06-04 NOTE — Progress Notes (Signed)
Dr. Dema Severin in to speak with pt. Explaining because of time of day, that the potential for doing the case robotically was slim and would most likely have to do the procedure as an open procedure.  Pt wishes to reschedule due to this. IV removed, getting dressed.

## 2020-06-04 NOTE — Consult Note (Signed)
WOC marked this patient 05/25/20 for surgery, new orders DC for this reason. [place], [place], [place]

## 2020-06-05 ENCOUNTER — Other Ambulatory Visit: Payer: Self-pay | Admitting: Urology

## 2020-06-13 ENCOUNTER — Ambulatory Visit: Payer: Self-pay | Admitting: Surgery

## 2020-06-13 NOTE — Patient Instructions (Signed)
DUE TO COVID-19 ONLY ONE VISITOR IS ALLOWED TO COME WITH YOU AND STAY IN THE WAITING ROOM ONLY DURING PRE OP AND PROCEDURE DAY OF SURGERY. THE 1 VISITOR  MAY VISIT WITH YOU AFTER SURGERY IN YOUR PRIVATE ROOM DURING VISITING HOURS ONLY!  YOU NEED TO HAVE A COVID 19 TEST ON 06-26-20 @_______ , THIS TEST MUST BE DONE BEFORE SURGERY,  COVID TESTING SITE [place] [address], IT IS ON THE RIGHT GOING OUT [address] APPROXIMATELY  2 MINUTES PAST [employer] ON THE RIGHT. ONCE YOUR COVID TEST IS COMPLETED,  PLEASE BEGIN THE QUARANTINE INSTRUCTIONS AS OUTLINED IN YOUR HANDOUT.                Olivia Gomez  06/13/2020   Your procedure is scheduled on: 06-29-20   Report to [facility] Main  Entrance    Report to Admitting at 10:30 AM     Call this number if you have problems the morning of surgery [phone]    Remember: After Midnight. You may have clear liquids from midnight until 9:30 AM. After 9:30 AM, nothing until after surgery.     CLEAR LIQUID DIET   Foods Allowed                                                                       Coffee and tea, regular and decaf                              Plain Jell-O any favor except red or purple                                            Fruit ices (not with fruit pulp)                                      Iced Popsicles                                     Carbonated beverages, regular and diet                                    Cranberry, grape and apple juices Sports drinks like Gatorade Lightly seasoned clear broth or consume(fat free) Sugar, honey syrup   _____________________________________________________________________       Take these medicines the morning of surgery with A SIP OF WATER:   BRUSH YOUR TEETH MORNING OF SURGERY AND RINSE YOUR MOUTH OUT, NO CHEWING GUM [place] OR [place]TS.                                 You may not have any metal on your body including hair pins and  piercings     Do not wear jewelry, make-up, lotions, powders or perfumes, deodorant              Do not wear nail polish on your fingernails.  Do not shave  48 hours prior to surgery.               Do not bring valuables to the hospital. [place].  Contacts, dentures or bridgework may not be worn into surgery.  You may bring a small overnight bag     Special Instructions: N/A              Please read over the following fact sheets you were given: _____________________________________________________________________  [facility] - Preparing for Surgery Before surgery, you can play an important role.  Because skin is not sterile, your skin needs to be as free of germs as possible.  You can reduce the number of germs on your skin by washing with CHG (chlorahexidine gluconate) soap before surgery.  CHG is an antiseptic cleaner which kills germs and bonds with the skin to continue killing germs even after washing. Please DO NOT use if you have an allergy to CHG or antibacterial soaps.  If your skin becomes reddened/irritated stop using the CHG and inform your nurse when you arrive at Short Stay. Do not shave (including legs and underarms) for at least 48 hours prior to the first CHG shower.  You may shave your face/neck. Please follow these instructions carefully:  1.  Shower with CHG Soap the night before surgery and the  morning of Surgery.  2.  If you choose to wash your hair, wash your hair first as usual with your  normal  shampoo.  3.  After you shampoo, rinse your hair and body thoroughly to remove the  shampoo.                           4.  Use CHG as you would any other liquid soap.  You can apply chg directly  to the skin and wash                       Gently with a scrungie or clean washcloth.  5.  Apply the CHG Soap to your body ONLY FROM THE NECK DOWN.   Do not use on face/ open                           Wound or open  sores. Avoid contact with eyes, ears mouth and genitals (private parts).                       Wash face,  Genitals (private parts) with your normal soap.             6.  Wash thoroughly, paying special attention to the area where your surgery  will be performed.  7.  Thoroughly rinse your body with warm water from the neck down.  8.  DO NOT shower/wash with your normal soap after using and rinsing off  the CHG Soap.                9.  Pat yourself dry with a clean towel.  10.  Wear clean pajamas.            11.  Place clean sheets on your bed the night of your first shower and do not  sleep with pets. Day of Surgery : Do not apply any lotions/deodorants the morning of surgery.  Please wear clean clothes to the hospital/surgery center.  FAILURE TO FOLLOW THESE INSTRUCTIONS MAY RESULT IN THE CANCELLATION OF YOUR SURGERY PATIENT SIGNATURE_________________________________  NURSE SIGNATURE__________________________________  ________________________________________________________________________

## 2020-06-13 NOTE — Progress Notes (Signed)
COVID Vaccine Completed: Date COVID Vaccine completed: COVID vaccine manufacturer: [place]   PCP - Marda Stalker, PA-C Cardiologist -   Chest x-ray -  EKG -  Stress Test -  ECHO -  Cardiac Cath -   Sleep Study -  CPAP -   Fasting Blood Sugar -  Checks Blood Sugar _____ times a day  Blood Thinner Instructions: Aspirin Instructions: 81mg  ASA Last Dose:  Anesthesia review:   Patient denies shortness of breath, fever, cough and chest pain at PAT appointment   Patient verbalized understanding of instructions that were given to them at the PAT appointment. Patient was also instructed that they will need to review over the PAT instructions again at home before surgery.

## 2020-06-13 NOTE — Progress Notes (Signed)
Please provide surgical orders. Pt is scheduled for PAT on 06-18-20. Thank you.

## 2020-06-15 ENCOUNTER — Other Ambulatory Visit (HOSPITAL_COMMUNITY): Payer: Self-pay | Admitting: Surgery

## 2020-06-15 ENCOUNTER — Other Ambulatory Visit: Payer: Self-pay | Admitting: Surgery

## 2020-06-15 DIAGNOSIS — K5792 Diverticulitis of intestine, part unspecified, without perforation or abscess without bleeding: Secondary | ICD-10-CM

## 2020-06-15 NOTE — Progress Notes (Addendum)
DUE TO COVID-19 ONLY ONE VISITOR IS ALLOWED TO COME WITH YOU AND STAY IN THE WAITING ROOM ONLY DURING PRE OP AND PROCEDURE DAY OF SURGERY. THE 1 VISITOR  MAY VISIT WITH YOU AFTER SURGERY IN YOUR PRIVATE ROOM DURING VISITING HOURS ONLY!  YOU NEED TO HAVE A COVID 19 TEST ON_______ @_______ , THIS TEST MUST BE DONE BEFORE SURGERY,  COVID TESTING SITE [address] [place] [address], IT IS ON THE RIGHT GOING OUT [address] APPROXIMATELY  2 MINUTES PAST [employer] ON THE RIGHT. ONCE YOUR COVID TEST IS COMPLETED,  PLEASE BEGIN THE QUARANTINE INSTRUCTIONS AS OUTLINED IN YOUR HANDOUT.                Olivia Gomez  06/15/2020   Your procedure is scheduled on:    Report to [facility] Main  Entrance   Report to admitting at AM     Call this number if you have problems the morning of surgery [phone]           REMEMBER: FOLLOW ALL [place]. DRINK 2 PRESURGERY ENSURE DRINKS THE NIGHT BEFORE SURGERY AT 1000 PM AND 1 PRESURGERY DRINK THE DAY OF THE PROCEDURE 3 HOURS PRIOR TO SCHEDULED SURGERY.  NOTHING BY MOUTH EXCEPT CLEAR LIQUIDS UNTIL THREE HOURS PRIOR TO SCHEDULED SURGERY. PLEASE FINISH PRESURGERY 3RD  ENSURE  DRINK PER SURGEON ORDER 3 HOURS PRIOR TO SCHEDULED SURGERY TIME WHICH NEEDS TO BE COMPLETED AT _________.   Clear liquid diet on day fo bowel prep.   CLEAR LIQUID DIET   Foods Allowed                                                                      Coffee and tea, regular and decaf                           Plain Jell-O any favor except red or purple                                         Fruit ices (not with fruit pulp)                                      Iced Popsicles                                     Carbonated beverages, regular and diet                                    Cranberry, grape and apple juices Sports drinks like Gatorade Lightly seasoned clear broth or  consume(fat free) Sugar, honey syrup  _____________________________________________________________________      BRUSH YOUR TEETH MORNING OF SURGERY AND RINSE YOUR MOUTH OUT, NO CHEWING GUM CANDY OR MINTS.     Take these  medicines the morning of surgery with A SIP OF WATER:   DO NOT TAKE ANY DIABETIC MEDICATIONS DAY OF YOUR SURGERY                               You may not have any metal on your body including hair pins and              piercings  Do not wear jewelry, make-up, lotions, powders or perfumes, deodorant             Do not wear nail polish on your fingernails.  Do not shave  48 hours prior to surgery.              Men may shave face and neck.   Do not bring valuables to the hospital. [place].  Contacts, dentures or bridgework may not be worn into surgery.  Leave suitcase in the car. After surgery it may be brought to your room.                 Please read over the following fact sheets you were given: _____________________________________________________________________  [facility] - Preparing for Surgery Before surgery, you can play an important role.  Because skin is not sterile, your skin needs to be as free of germs as possible.  You can reduce the number of germs on your skin by washing with CHG (chlorahexidine gluconate) soap before surgery.  CHG is an antiseptic cleaner which kills germs and bonds with the skin to continue killing germs even after washing. Please DO NOT use if you have an allergy to CHG or antibacterial soaps.  If your skin becomes reddened/irritated stop using the CHG and inform your nurse when you arrive at Short Stay. Do not shave (including legs and underarms) for at least 48 hours prior to the first CHG shower.  You may shave your face/neck. Please follow these instructions carefully:  1.  Shower with CHG Soap the night before surgery and the  morning of Surgery.  2.  If you choose to wash  your hair, wash your hair first as usual with your  normal  shampoo.  3.  After you shampoo, rinse your hair and body thoroughly to remove the  shampoo.                           4.  Use CHG as you would any other liquid soap.  You can apply chg directly  to the skin and wash                       Gently with a scrungie or clean washcloth.  5.  Apply the CHG Soap to your body ONLY FROM THE NECK DOWN.   Do not use on face/ open                           Wound or open sores. Avoid contact with eyes, ears mouth and genitals (private parts).                       Wash face,  Genitals (private parts) with your normal soap.  6.  Wash thoroughly, paying special attention to the area where your surgery  will be performed.  7.  Thoroughly rinse your body with warm water from the neck down.  8.  DO NOT shower/wash with your normal soap after using and rinsing off  the CHG Soap.                9.  Pat yourself dry with a clean towel.            10.  Wear clean pajamas.            11.  Place clean sheets on your bed the night of your first shower and do not  sleep with pets. Day of Surgery : Do not apply any lotions/deodorants the morning of surgery.  Please wear clean clothes to the hospital/surgery center.  FAILURE TO FOLLOW THESE INSTRUCTIONS MAY RESULT IN THE CANCELLATION OF YOUR SURGERY PATIENT SIGNATURE_________________________________  NURSE SIGNATURE__________________________________  ________________________________________________________________________            DUE TO COVID-19 ONLY ONE VISITOR IS ALLOWED TO COME WITH YOU AND STAY IN THE WAITING ROOM ONLY DURING PRE OP AND PROCEDURE DAY OF SURGERY. THE 1 VISITOR  MAY VISIT WITH YOU AFTER SURGERY IN YOUR PRIVATE ROOM DURING VISITING HOURS ONLY!  YOU NEED TO HAVE A COVID 19 TEST ON__9/04/2020 _____ @_______ , THIS TEST MUST BE DONE BEFORE SURGERY,  COVID TESTING SITE [address] [place] [id number], IT IS ON THE RIGHT  GOING OUT [address] APPROXIMATELY  2 MINUTES PAST [employer] ON THE RIGHT. ONCE YOUR COVID TEST IS COMPLETED,  PLEASE BEGIN THE QUARANTINE INSTRUCTIONS AS OUTLINED IN YOUR HANDOUT.                Olivia Gomez  06/15/2020   Your procedure is scheduled on: 06/29/2020    Report to [facility] Main  Entrance   Report to admitting at     AM     Call this number if you have problems the morning of surgery [phone]           REMEMBER: FOLLOW ALL YOUR BOWEL PREP INSTRUCTIONS WITH CLEAR LIQUIDS FROM [facility]. DRINK 2 PRESURGERY ENSURE DRINKS THE NIGHT BEFORE SURGERY AT 1000 PM AND 1 PRESURGERY DRINK THE DAY OF THE PROCEDURE 3 HOURS PRIOR TO SCHEDULED SURGERY.  NOTHING BY MOUTH EXCEPT CLEAR LIQUIDS UNTIL THREE HOURS PRIOR TO SCHEDULED SURGERY. PLEASE FINISH PRESURGERY 3RD  ENSURE  DRINK PER SURGEON ORDER 3 HOURS PRIOR TO SCHEDULED SURGERY TIME WHICH NEEDS TO BE COMPLETED AT _________.     CLEAR LIQUID DIET   Foods Allowed                                                                      Coffee and tea, regular and decaf                           Plain Jell-O any favor except red or purple  Fruit ices (not with fruit pulp)                                      Iced Popsicles                                     Carbonated beverages, regular and diet                                    Cranberry, grape and apple juices Sports drinks like Gatorade Lightly seasoned clear broth or consume(fat free) Sugar, honey syrup  _____________________________________________________________________      BRUSH YOUR TEETH MORNING OF SURGERY AND RINSE YOUR MOUTH OUT, NO CHEWING GUM CANDY OR MINTS.     Take these medicines the morning of surgery with A SIP OF WATER: none                                  You may not have any metal on your body including hair pins and              piercings  Do not wear jewelry,  make-up, lotions, powders or perfumes, deodorant             Do not wear nail polish on your fingernails.  Do not shave  48 hours prior to surgery.              Men may shave face and neck.   Do not bring valuables to the hospital. [place].  Contacts, dentures or bridgework may not be worn into surgery.  Leave suitcase in the car. After surgery it may be brought to your room.                 Please read over the following fact sheets you were given: _____________________________________________________________________  [facility] - Preparing for Surgery Before surgery, you can play an important role.  Because skin is not sterile, your skin needs to be as free of germs as possible.  You can reduce the number of germs on your skin by washing with CHG (chlorahexidine gluconate) soap before surgery.  CHG is an antiseptic cleaner which kills germs and bonds with the skin to continue killing germs even after washing. Please DO NOT use if you have an allergy to CHG or antibacterial soaps.  If your skin becomes reddened/irritated stop using the CHG and inform your nurse when you arrive at Short Stay. Do not shave (including legs and underarms) for at least 48 hours prior to the first CHG shower.  You may shave your face/neck. Please follow these instructions carefully:  1.  Shower with CHG Soap the night before surgery and the  morning of Surgery.  2.  If you choose to wash your hair, wash your hair first as usual with your  normal  shampoo.  3.  After you shampoo, rinse your hair and body thoroughly to remove the  shampoo.  4.  Use CHG as you would any other liquid soap.  You can apply chg directly  to the skin and wash                       Gently with a scrungie or clean washcloth.  5.  Apply the CHG Soap to your body ONLY FROM THE NECK DOWN.   Do not use on face/ open                           Wound or open sores. Avoid  contact with eyes, ears mouth and genitals (private parts).                       Wash face,  Genitals (private parts) with your normal soap.             6.  Wash thoroughly, paying special attention to the area where your surgery  will be performed.  7.  Thoroughly rinse your body with warm water from the neck down.  8.  DO NOT shower/wash with your normal soap after using and rinsing off  the CHG Soap.                9.  Pat yourself dry with a clean towel.            10.  Wear clean pajamas.            11.  Place clean sheets on your bed the night of your first shower and do not  sleep with pets. Day of Surgery : Do not apply any lotions/deodorants the morning of surgery.  Please wear clean clothes to the hospital/surgery center.  FAILURE TO FOLLOW THESE INSTRUCTIONS MAY RESULT IN THE CANCELLATION OF YOUR SURGERY PATIENT SIGNATURE_________________________________  NURSE SIGNATURE__________________________________  ________________________________________________________________________

## 2020-06-18 ENCOUNTER — Other Ambulatory Visit: Payer: Self-pay

## 2020-06-18 ENCOUNTER — Encounter (HOSPITAL_COMMUNITY): Payer: Self-pay

## 2020-06-18 ENCOUNTER — Encounter (HOSPITAL_COMMUNITY)
Admission: RE | Admit: 2020-06-18 | Discharge: 2020-06-18 | Disposition: A | Discharge status: Home / Self Care | Payer: Medicare HMO | Source: Ambulatory Visit | Attending: Surgery | Admitting: Surgery

## 2020-06-18 DIAGNOSIS — Z01812 Encounter for preprocedural laboratory examination: Secondary | ICD-10-CM | POA: Diagnosis present

## 2020-06-18 LAB — CBC WITH DIFFERENTIAL/PLATELET
Abs Immature Granulocytes: 0.02 10*3/uL (ref 0.00–0.07)
Basophils Absolute: 0.1 10*3/uL (ref 0.0–0.1)
Basophils Relative: 1 %
Eosinophils Absolute: 0.1 10*3/uL (ref 0.0–0.5)
Eosinophils Relative: 1 %
HCT: 38.4 % (ref 36.0–46.0)
Hemoglobin: 12.3 g/dL (ref 12.0–15.0)
Immature Granulocytes: 0 %
Lymphocytes Relative: 22 %
Lymphs Abs: 1.6 10*3/uL (ref 0.7–4.0)
MCH: 29.9 pg (ref 26.0–34.0)
MCHC: 32 g/dL (ref 30.0–36.0)
MCV: 93.4 fL (ref 80.0–100.0)
Monocytes Absolute: 0.7 10*3/uL (ref 0.1–1.0)
Monocytes Relative: 9 %
Neutro Abs: 5.1 10*3/uL (ref 1.7–7.7)
Neutrophils Relative %: 67 %
Platelets: 288 10*3/uL (ref 150–400)
RBC: 4.11 MIL/uL (ref 3.87–5.11)
RDW: 14.6 % (ref 11.5–15.5)
WBC: 7.5 10*3/uL (ref 4.0–10.5)
nRBC: 0 % (ref 0.0–0.2)

## 2020-06-18 LAB — TYPE AND SCREEN
ABO/RH(D): O POS
Antibody Screen: NEGATIVE

## 2020-06-18 LAB — COMPREHENSIVE METABOLIC PANEL
ALT: 14 U/L (ref 0–44)
AST: 21 U/L (ref 15–41)
Albumin: 3.7 g/dL (ref 3.5–5.0)
Alkaline Phosphatase: 47 U/L (ref 38–126)
Anion gap: 7 (ref 5–15)
BUN: 14 mg/dL (ref 8–23)
CO2: 27 mmol/L (ref 22–32)
Calcium: 9.5 mg/dL (ref 8.9–10.3)
Chloride: 105 mmol/L (ref 98–111)
Creatinine, Ser: 0.7 mg/dL (ref 0.44–1.00)
GFR calc Af Amer: >60 mL/min (ref >60)
GFR calc non Af Amer: >60 mL/min (ref >60)
Glucose, Bld: 88 mg/dL (ref 70–99)
Potassium: 4.4 mmol/L (ref 3.5–5.1)
Sodium: 139 mmol/L (ref 135–145)
Total Bilirubin: 0.6 mg/dL (ref 0.3–1.2)
Total Protein: 6.5 g/dL (ref 6.5–8.1)

## 2020-06-18 NOTE — Consult Note (Addendum)
Requested to mark patient for possible ostomy surgery; this was already performed on 8/6 and the marks remain in place with Tegaderm over the sites:   Refer to previous progress note: [name] Nurse requested for preoperative stoma site marking  Discussed surgical procedure and stoma creation with patient.  Explained role of the [facility] nurse team.  Provided the patient with educational booklet and provided samples of pouching options.  Answered patient's questions.   Examined patient sitting, and standing in order to place the marking in the patient's visual field, away from any creases or abdominal contour issues and within the rectus muscle.  Attempted to mark below the patient's belt line. There is a significant skin fold which occurs lower on the abd when the patient leans forward and should be avoided if possible.   Marked for colostomy in the LLQ  _6___ cm to the left of the umbilicus and __even with the umbilicus.  Marked for ileostomy in the RLQ  __6__cm to the right of the umbilicus and  _even__with the umbilicus.  [facility] Nurse team will follow up with patient after surgery for continued ostomy care and teaching if she receives an ostomy.  Julien Girt MSN, RN, [place], [place], [place]

## 2020-06-18 NOTE — Progress Notes (Signed)
Called office of [facility] with pt present due to pt states she did not yet have instructions regarding bowel prep prior to surgery. Olivia Gomez at [facility] via phone informed pt to follow previous bowel prep instructions.  Patient is not sure if she has Neomycin script at home .  Patient to check when arrives home and call [facility] if she does nost have all of bowel prep.

## 2020-06-20 ENCOUNTER — Ambulatory Visit (HOSPITAL_COMMUNITY)
Admission: RE | Admit: 2020-06-20 | Discharge: 2020-06-20 | Disposition: A | Discharge status: Home / Self Care | Payer: Medicare HMO | Source: Ambulatory Visit | Attending: Surgery | Admitting: Surgery

## 2020-06-20 ENCOUNTER — Encounter (HOSPITAL_COMMUNITY): Payer: Self-pay

## 2020-06-20 DIAGNOSIS — K5792 Diverticulitis of intestine, part unspecified, without perforation or abscess without bleeding: Secondary | ICD-10-CM | POA: Diagnosis present

## 2020-06-20 DIAGNOSIS — K5732 Diverticulitis of large intestine without perforation or abscess without bleeding: Secondary | ICD-10-CM | POA: Diagnosis not present

## 2020-06-20 DIAGNOSIS — N1330 Unspecified hydronephrosis: Secondary | ICD-10-CM | POA: Diagnosis not present

## 2020-06-20 DIAGNOSIS — I700 Atherosclerosis of aorta: Secondary | ICD-10-CM | POA: Diagnosis not present

## 2020-06-20 DIAGNOSIS — D259 Leiomyoma of uterus, unspecified: Secondary | ICD-10-CM | POA: Diagnosis not present

## 2020-06-20 MED ORDER — IOHEXOL 300 MG/ML  SOLN
100.0000 mL | Freq: Once | INTRAMUSCULAR | Status: AC | PRN
  Administered 2020-06-20: 100 mL via INTRAVENOUS

## 2020-06-22 ENCOUNTER — Ambulatory Visit (HOSPITAL_COMMUNITY): Payer: Medicare HMO

## 2020-06-26 ENCOUNTER — Other Ambulatory Visit (HOSPITAL_COMMUNITY)
Admission: RE | Admit: 2020-06-26 | Discharge: 2020-06-26 | Disposition: A | Discharge status: Home / Self Care | Payer: Medicare HMO | Source: Ambulatory Visit | Attending: Surgery | Admitting: Surgery

## 2020-06-26 DIAGNOSIS — Z01812 Encounter for preprocedural laboratory examination: Secondary | ICD-10-CM | POA: Insufficient documentation

## 2020-06-26 DIAGNOSIS — Z20822 Contact with and (suspected) exposure to covid-19: Secondary | ICD-10-CM | POA: Insufficient documentation

## 2020-06-27 LAB — SARS CORONAVIRUS 2 (TAT 6-24 HRS): SARS Coronavirus 2: NEGATIVE

## 2020-06-28 MED ORDER — BUPIVACAINE LIPOSOME 1.3 % IJ SUSP
20.0000 mL | INTRAMUSCULAR | Status: DC
  Filled 2020-06-28: qty 20

## 2020-06-28 NOTE — Anesthesia Preprocedure Evaluation (Addendum)
Anesthesia Evaluation  Patient identified by MRN, date of birth, ID band Patient awake    Reviewed: Allergy & Precautions, H&P , NPO status , Patient's Chart, lab work & pertinent test results  History of Anesthesia Complications Negative for: history of anesthetic complications  Airway Mallampati: II  TM Distance: >3 FB Neck ROM: Full    Dental no notable dental hx. (+) Dental Advisory Given   Pulmonary COPD, Current Smoker and Patient abstained from smoking.,    Pulmonary exam normal        Cardiovascular negative cardio ROS Normal cardiovascular exam     Neuro/Psych negative neurological ROS  negative psych ROS   GI/Hepatic Neg liver ROS, Diverticulitis   Endo/Other  negative endocrine ROS  Renal/GU negative Renal ROS  negative genitourinary   Musculoskeletal   Abdominal   Peds  Hematology negative hematology ROS (+)   Anesthesia Other Findings   Reproductive/Obstetrics negative OB ROS                            Anesthesia Physical  Anesthesia Plan  ASA: III  Anesthesia Plan: General   Post-op Pain Management:    Induction: Intravenous  PONV Risk Score and Plan: 4 or greater and Ondansetron, Dexamethasone and Treatment may vary due to age or medical condition  Airway Management Planned: Oral ETT  Additional Equipment:   Intra-op Plan:   Post-operative Plan: Extubation in OR  Informed Consent: I have reviewed the patients History and Physical, chart, labs and discussed the procedure including the risks, benefits and alternatives for the proposed anesthesia with the patient or authorized representative who has indicated his/her understanding and acceptance.     Dental advisory given  Plan Discussed with: Anesthesiologist and CRNA  Anesthesia Plan Comments:        Anesthesia Quick Evaluation

## 2020-06-29 ENCOUNTER — Inpatient Hospital Stay (HOSPITAL_COMMUNITY)
Admission: RE | Admit: 2020-06-29 | Discharge: 2020-07-02 | DRG: 331 | Disposition: A | Discharge status: Home / Self Care | Payer: Medicare HMO | Source: Ambulatory Visit | Attending: Surgery | Admitting: Surgery

## 2020-06-29 ENCOUNTER — Encounter (HOSPITAL_COMMUNITY): Payer: Self-pay | Admitting: Surgery

## 2020-06-29 ENCOUNTER — Encounter (HOSPITAL_COMMUNITY)
Admission: RE | Disposition: A | Discharge status: Home / Self Care | Payer: Self-pay | Source: Ambulatory Visit | Attending: Surgery

## 2020-06-29 ENCOUNTER — Inpatient Hospital Stay (HOSPITAL_COMMUNITY): Payer: Medicare HMO | Admitting: Anesthesiology

## 2020-06-29 ENCOUNTER — Telehealth (HOSPITAL_COMMUNITY): Payer: Self-pay | Admitting: *Deleted

## 2020-06-29 ENCOUNTER — Inpatient Hospital Stay (HOSPITAL_COMMUNITY): Payer: Medicare HMO

## 2020-06-29 ENCOUNTER — Other Ambulatory Visit: Payer: Self-pay

## 2020-06-29 DIAGNOSIS — Z888 Allergy status to other drugs, medicaments and biological substances status: Secondary | ICD-10-CM | POA: Diagnosis not present

## 2020-06-29 DIAGNOSIS — Z20822 Contact with and (suspected) exposure to covid-19: Secondary | ICD-10-CM | POA: Diagnosis not present

## 2020-06-29 DIAGNOSIS — Z8249 Family history of ischemic heart disease and other diseases of the circulatory system: Secondary | ICD-10-CM | POA: Diagnosis not present

## 2020-06-29 DIAGNOSIS — L219 Seborrheic dermatitis, unspecified: Secondary | ICD-10-CM | POA: Diagnosis present

## 2020-06-29 DIAGNOSIS — F17210 Nicotine dependence, cigarettes, uncomplicated: Secondary | ICD-10-CM | POA: Diagnosis present

## 2020-06-29 DIAGNOSIS — Z85828 Personal history of other malignant neoplasm of skin: Secondary | ICD-10-CM

## 2020-06-29 DIAGNOSIS — K5730 Diverticulosis of large intestine without perforation or abscess without bleeding: Secondary | ICD-10-CM | POA: Diagnosis not present

## 2020-06-29 DIAGNOSIS — E559 Vitamin D deficiency, unspecified: Secondary | ICD-10-CM | POA: Diagnosis not present

## 2020-06-29 DIAGNOSIS — K5732 Diverticulitis of large intestine without perforation or abscess without bleeding: Principal | ICD-10-CM | POA: Diagnosis present

## 2020-06-29 DIAGNOSIS — J449 Chronic obstructive pulmonary disease, unspecified: Secondary | ICD-10-CM | POA: Diagnosis not present

## 2020-06-29 DIAGNOSIS — K5792 Diverticulitis of intestine, part unspecified, without perforation or abscess without bleeding: Secondary | ICD-10-CM | POA: Diagnosis not present

## 2020-06-29 DIAGNOSIS — K5720 Diverticulitis of large intestine with perforation and abscess without bleeding: Secondary | ICD-10-CM | POA: Diagnosis not present

## 2020-06-29 DIAGNOSIS — Z408 Encounter for other prophylactic surgery: Secondary | ICD-10-CM | POA: Diagnosis not present

## 2020-06-29 DIAGNOSIS — R69 Illness, unspecified: Secondary | ICD-10-CM | POA: Diagnosis not present

## 2020-06-29 DIAGNOSIS — Z9049 Acquired absence of other specified parts of digestive tract: Secondary | ICD-10-CM

## 2020-06-29 HISTORY — PX: FLEXIBLE SIGMOIDOSCOPY: SHX5431

## 2020-06-29 HISTORY — PX: CYSTOSCOPY WITH STENT PLACEMENT: SHX5790

## 2020-06-29 SURGERY — COLECTOMY, SIGMOID, ROBOT-ASSISTED
Anesthesia: General

## 2020-06-29 MED ORDER — SIMETHICONE 80 MG PO CHEW
40.0000 mg | CHEWABLE_TABLET | Freq: Four times a day (QID) | ORAL | Status: DC | PRN
  Administered 2020-07-01: 40 mg via ORAL
  Filled 2020-06-29: qty 1

## 2020-06-29 MED ORDER — ENSURE PRE-SURGERY PO LIQD
592.0000 mL | Freq: Once | ORAL | Status: DC
  Filled 2020-06-29: qty 592

## 2020-06-29 MED ORDER — POLYETHYLENE GLYCOL 3350 17 GM/SCOOP PO POWD
1.0000 | Freq: Once | ORAL | Status: DC
  Filled 2020-06-29: qty 255

## 2020-06-29 MED ORDER — STERILE WATER FOR INJECTION IJ SOLN
INTRAMUSCULAR | Status: DC | PRN
  Administered 2020-06-29: 10 mL via SURGICAL_CAVITY

## 2020-06-29 MED ORDER — FENTANYL CITRATE (PF) 100 MCG/2ML IJ SOLN
25.0000 ug | INTRAMUSCULAR | Status: DC | PRN
  Administered 2020-06-29: 50 ug via INTRAVENOUS

## 2020-06-29 MED ORDER — EPHEDRINE SULFATE-NACL 50-0.9 MG/10ML-% IV SOSY
PREFILLED_SYRINGE | INTRAVENOUS | Status: DC | PRN
  Administered 2020-06-29 (×2): 15 mg via INTRAVENOUS

## 2020-06-29 MED ORDER — SUCCINYLCHOLINE CHLORIDE 200 MG/10ML IV SOSY
PREFILLED_SYRINGE | INTRAVENOUS | Status: DC | PRN
  Administered 2020-06-29: 100 mg via INTRAVENOUS

## 2020-06-29 MED ORDER — ONDANSETRON HCL 4 MG/2ML IJ SOLN
INTRAMUSCULAR | Status: AC
  Filled 2020-06-29: qty 2

## 2020-06-29 MED ORDER — 0.9 % SODIUM CHLORIDE (POUR BTL) OPTIME
TOPICAL | Status: DC | PRN
  Administered 2020-06-29: 2000 mL

## 2020-06-29 MED ORDER — ROCURONIUM BROMIDE 10 MG/ML (PF) SYRINGE
PREFILLED_SYRINGE | INTRAVENOUS | Status: DC | PRN
  Administered 2020-06-29: 20 mg via INTRAVENOUS
  Administered 2020-06-29: 60 mg via INTRAVENOUS

## 2020-06-29 MED ORDER — NEOMYCIN SULFATE 500 MG PO TABS: 1000.0000 mg | ORAL_TABLET | Freq: Three times a day (TID) | ORAL | Status: DC

## 2020-06-29 MED ORDER — SODIUM CHLORIDE 0.9 % IV SOLN
INTRAVENOUS | Status: AC
  Filled 2020-06-29: qty 2

## 2020-06-29 MED ORDER — TRAMADOL HCL 50 MG PO TABS
50.0000 mg | ORAL_TABLET | Freq: Four times a day (QID) | ORAL | Status: DC | PRN
  Administered 2020-06-29 – 2020-07-02 (×4): 50 mg via ORAL
  Filled 2020-06-29 (×4): qty 1

## 2020-06-29 MED ORDER — HEPARIN SODIUM (PORCINE) 5000 UNIT/ML IJ SOLN
5000.0000 [IU] | Freq: Three times a day (TID) | INTRAMUSCULAR | Status: DC
  Administered 2020-06-29 – 2020-07-02 (×8): 5000 [IU] via SUBCUTANEOUS
  Filled 2020-06-29 (×8): qty 1

## 2020-06-29 MED ORDER — ONDANSETRON HCL 4 MG/2ML IJ SOLN: 4.0000 mg | Freq: Four times a day (QID) | INTRAMUSCULAR | Status: DC | PRN

## 2020-06-29 MED ORDER — BUPIVACAINE LIPOSOME 1.3 % IJ SUSP
INTRAMUSCULAR | Status: DC | PRN
  Administered 2020-06-29: 20 mL

## 2020-06-29 MED ORDER — ALVIMOPAN 12 MG PO CAPS
12.0000 mg | ORAL_CAPSULE | Freq: Two times a day (BID) | ORAL | Status: DC
  Administered 2020-06-30 – 2020-07-01 (×2): 12 mg via ORAL
  Filled 2020-06-29 (×3): qty 1

## 2020-06-29 MED ORDER — ENSURE SURGERY PO LIQD
237.0000 mL | Freq: Two times a day (BID) | ORAL | Status: DC
  Administered 2020-06-30 – 2020-07-02 (×5): 237 mL via ORAL

## 2020-06-29 MED ORDER — DEXAMETHASONE SODIUM PHOSPHATE 10 MG/ML IJ SOLN
INTRAMUSCULAR | Status: AC
  Filled 2020-06-29: qty 1

## 2020-06-29 MED ORDER — PHENYLEPHRINE 40 MCG/ML (10ML) SYRINGE FOR IV PUSH (FOR BLOOD PRESSURE SUPPORT)
PREFILLED_SYRINGE | INTRAVENOUS | Status: DC | PRN
  Administered 2020-06-29: 120 ug via INTRAVENOUS
  Administered 2020-06-29: 80 ug via INTRAVENOUS
  Administered 2020-06-29: 120 ug via INTRAVENOUS

## 2020-06-29 MED ORDER — ONDANSETRON HCL 4 MG PO TABS: 4.0000 mg | ORAL_TABLET | Freq: Four times a day (QID) | ORAL | Status: DC | PRN

## 2020-06-29 MED ORDER — FENTANYL CITRATE (PF) 100 MCG/2ML IJ SOLN
INTRAMUSCULAR | Status: AC
  Administered 2020-06-29: 50 ug via INTRAVENOUS
  Filled 2020-06-29: qty 2

## 2020-06-29 MED ORDER — IBUPROFEN 200 MG PO TABS: 600.0000 mg | ORAL_TABLET | Freq: Four times a day (QID) | ORAL | Status: DC | PRN

## 2020-06-29 MED ORDER — PHENYLEPHRINE HCL-NACL 10-0.9 MG/250ML-% IV SOLN
INTRAVENOUS | Status: AC
  Filled 2020-06-29: qty 250

## 2020-06-29 MED ORDER — ACETAMINOPHEN 500 MG PO TABS
ORAL_TABLET | ORAL | Status: AC
  Administered 2020-06-29: 1000 mg via ORAL
  Filled 2020-06-29: qty 2

## 2020-06-29 MED ORDER — CHLORHEXIDINE GLUCONATE CLOTH 2 % EX PADS: 6.0000 | MEDICATED_PAD | Freq: Once | CUTANEOUS | Status: DC

## 2020-06-29 MED ORDER — ACETAMINOPHEN 500 MG PO TABS: 1000.0000 mg | ORAL_TABLET | Freq: Once | ORAL | Status: DC

## 2020-06-29 MED ORDER — STERILE WATER FOR INJECTION IJ SOLN
INTRAMUSCULAR | Status: DC | PRN
  Administered 2020-06-29: 20 mL via INTRAVENOUS

## 2020-06-29 MED ORDER — ALUM & MAG HYDROXIDE-SIMETH 200-200-20 MG/5ML PO SUSP
30.0000 mL | Freq: Four times a day (QID) | ORAL | Status: DC | PRN
  Administered 2020-06-30: 30 mL via ORAL
  Filled 2020-06-29: qty 30

## 2020-06-29 MED ORDER — EPHEDRINE 5 MG/ML INJ
INTRAVENOUS | Status: AC
  Filled 2020-06-29: qty 10

## 2020-06-29 MED ORDER — FENTANYL CITRATE (PF) 250 MCG/5ML IJ SOLN
INTRAMUSCULAR | Status: AC
  Filled 2020-06-29: qty 5

## 2020-06-29 MED ORDER — BISACODYL 5 MG PO TBEC: 20.0000 mg | DELAYED_RELEASE_TABLET | Freq: Once | ORAL | Status: DC

## 2020-06-29 MED ORDER — DIPHENHYDRAMINE HCL 12.5 MG/5ML PO ELIX: 12.5000 mg | ORAL_SOLUTION | Freq: Four times a day (QID) | ORAL | Status: DC | PRN

## 2020-06-29 MED ORDER — CHLORHEXIDINE GLUCONATE 0.12 % MT SOLN
15.0000 mL | OROMUCOSAL | Status: AC
  Administered 2020-06-29: 15 mL via OROMUCOSAL

## 2020-06-29 MED ORDER — SODIUM CHLORIDE 0.9 % IV SOLN
2.0000 g | INTRAVENOUS | Status: AC
  Administered 2020-06-29: 2 g via INTRAVENOUS

## 2020-06-29 MED ORDER — PROMETHAZINE HCL 25 MG/ML IJ SOLN: 6.2500 mg | INTRAMUSCULAR | Status: DC | PRN

## 2020-06-29 MED ORDER — SPY AGENT GREEN - (INDOCYANINE FOR INJECTION)
INTRAMUSCULAR | Status: DC | PRN
  Administered 2020-06-29: 5 mL via INTRAVENOUS

## 2020-06-29 MED ORDER — LIDOCAINE 2% (20 MG/ML) 5 ML SYRINGE
INTRAMUSCULAR | Status: DC | PRN
  Administered 2020-06-29: 100 mg via INTRAVENOUS

## 2020-06-29 MED ORDER — DIPHENHYDRAMINE HCL 50 MG/ML IJ SOLN: 12.5000 mg | Freq: Four times a day (QID) | INTRAMUSCULAR | Status: DC | PRN

## 2020-06-29 MED ORDER — ENSURE PRE-SURGERY PO LIQD
296.0000 mL | Freq: Once | ORAL | Status: DC
  Filled 2020-06-29: qty 296

## 2020-06-29 MED ORDER — IOHEXOL 300 MG/ML  SOLN
INTRAMUSCULAR | Status: DC | PRN
  Administered 2020-06-29: 10 mL

## 2020-06-29 MED ORDER — BUPIVACAINE-EPINEPHRINE (PF) 0.25% -1:200000 IJ SOLN
INTRAMUSCULAR | Status: AC
  Filled 2020-06-29: qty 30

## 2020-06-29 MED ORDER — LACTATED RINGERS IV SOLN: Freq: Once | INTRAVENOUS | Status: AC

## 2020-06-29 MED ORDER — ALVIMOPAN 12 MG PO CAPS: 12.0000 mg | ORAL_CAPSULE | ORAL | Status: AC

## 2020-06-29 MED ORDER — ONDANSETRON HCL 4 MG/2ML IJ SOLN
INTRAMUSCULAR | Status: DC | PRN
  Administered 2020-06-29: 4 mg via INTRAVENOUS

## 2020-06-29 MED ORDER — PROPOFOL 10 MG/ML IV BOLUS
INTRAVENOUS | Status: DC | PRN
  Administered 2020-06-29: 130 mg via INTRAVENOUS

## 2020-06-29 MED ORDER — ALVIMOPAN 12 MG PO CAPS
ORAL_CAPSULE | ORAL | Status: AC
  Administered 2020-06-29: 12 mg via ORAL
  Filled 2020-06-29: qty 1

## 2020-06-29 MED ORDER — KETAMINE HCL 10 MG/ML IJ SOLN
INTRAMUSCULAR | Status: DC | PRN
  Administered 2020-06-29: 20 mg via INTRAVENOUS

## 2020-06-29 MED ORDER — LACTATED RINGERS IV SOLN: INTRAVENOUS | Status: DC

## 2020-06-29 MED ORDER — DEXAMETHASONE SODIUM PHOSPHATE 10 MG/ML IJ SOLN
INTRAMUSCULAR | Status: DC | PRN
  Administered 2020-06-29: 5 mg via INTRAVENOUS

## 2020-06-29 MED ORDER — SUCCINYLCHOLINE CHLORIDE 200 MG/10ML IV SOSY
PREFILLED_SYRINGE | INTRAVENOUS | Status: AC
  Filled 2020-06-29: qty 10

## 2020-06-29 MED ORDER — STERILE WATER FOR IRRIGATION IR SOLN
Status: DC | PRN
  Administered 2020-06-29: 1000 mL

## 2020-06-29 MED ORDER — ACETAMINOPHEN 500 MG PO TABS: 1000.0000 mg | ORAL_TABLET | ORAL | Status: AC

## 2020-06-29 MED ORDER — HYDROMORPHONE HCL 1 MG/ML IJ SOLN
0.5000 mg | INTRAMUSCULAR | Status: DC | PRN
  Administered 2020-06-29: 0.5 mg via INTRAVENOUS
  Filled 2020-06-29: qty 0.5

## 2020-06-29 MED ORDER — FENTANYL CITRATE (PF) 100 MCG/2ML IJ SOLN: 25.0000 ug | INTRAMUSCULAR | Status: DC | PRN

## 2020-06-29 MED ORDER — PHENYLEPHRINE 40 MCG/ML (10ML) SYRINGE FOR IV PUSH (FOR BLOOD PRESSURE SUPPORT)
PREFILLED_SYRINGE | INTRAVENOUS | Status: AC
  Filled 2020-06-29: qty 10

## 2020-06-29 MED ORDER — KETAMINE HCL 10 MG/ML IJ SOLN
INTRAMUSCULAR | Status: AC
  Filled 2020-06-29: qty 1

## 2020-06-29 MED ORDER — ACETAMINOPHEN 500 MG PO TABS
1000.0000 mg | ORAL_TABLET | Freq: Four times a day (QID) | ORAL | Status: DC
  Administered 2020-06-29 – 2020-07-01 (×6): 1000 mg via ORAL
  Filled 2020-06-29 (×8): qty 2

## 2020-06-29 MED ORDER — HEPARIN SODIUM (PORCINE) 5000 UNIT/ML IJ SOLN
INTRAMUSCULAR | Status: AC
  Administered 2020-06-29: 5000 [IU] via SUBCUTANEOUS
  Filled 2020-06-29: qty 1

## 2020-06-29 MED ORDER — FENTANYL CITRATE (PF) 250 MCG/5ML IJ SOLN
INTRAMUSCULAR | Status: DC | PRN
Start: 2020-06-29 — End: 2020-06-29
  Administered 2020-06-29: 25 ug via INTRAVENOUS
  Administered 2020-06-29 (×2): 50 ug via INTRAVENOUS
  Administered 2020-06-29: 100 ug via INTRAVENOUS
  Administered 2020-06-29: 25 ug via INTRAVENOUS

## 2020-06-29 MED ORDER — SUGAMMADEX SODIUM 200 MG/2ML IV SOLN
INTRAVENOUS | Status: DC | PRN
  Administered 2020-06-29: 200 mg via INTRAVENOUS

## 2020-06-29 MED ORDER — LIDOCAINE 20MG/ML (2%) 15 ML SYRINGE OPTIME
INTRAMUSCULAR | Status: DC | PRN
  Administered 2020-06-29: 1.5 mg/kg/h via INTRAVENOUS

## 2020-06-29 MED ORDER — HYDRALAZINE HCL 20 MG/ML IJ SOLN: 10.0000 mg | INTRAMUSCULAR | Status: DC | PRN

## 2020-06-29 MED ORDER — PHENYLEPHRINE HCL-NACL 10-0.9 MG/250ML-% IV SOLN
INTRAVENOUS | Status: DC | PRN
  Administered 2020-06-29: 40 ug/min via INTRAVENOUS

## 2020-06-29 MED ORDER — BUPIVACAINE-EPINEPHRINE (PF) 0.25% -1:200000 IJ SOLN
INTRAMUSCULAR | Status: DC | PRN
  Administered 2020-06-29: 30 mL via PERINEURAL

## 2020-06-29 MED ORDER — HEPARIN SODIUM (PORCINE) 5000 UNIT/ML IJ SOLN: 5000.0000 [IU] | Freq: Once | INTRAMUSCULAR | Status: AC

## 2020-06-29 MED ORDER — STERILE WATER FOR INJECTION IJ SOLN
INTRAMUSCULAR | Status: AC
  Filled 2020-06-29: qty 10

## 2020-06-29 SURGICAL SUPPLY — 116 items
ADAPTER GOLDBERG URETERAL (ADAPTER) ×2 IMPLANT
APPLIER CLIP 5 13 M/L LIGAMAX5 (MISCELLANEOUS)
APPLIER CLIP ROT 10 11.4 M/L (STAPLE)
BAG URO CATCHER STRL LF (MISCELLANEOUS) ×2 IMPLANT
BASKET ZERO TIP NITINOL 2.4FR (BASKET) IMPLANT
BLADE EXTENDED COATED 6.5IN (ELECTRODE) ×2 IMPLANT
CANNULA REDUC XI 12-8 STAPL (CANNULA) ×1
CANNULA REDUCER 12-8 DVNC XI (CANNULA) ×1 IMPLANT
CATH FOLEY 2WAY SLVR 30CC 16FR (CATHETERS) ×2 IMPLANT
CATH INTERMIT  6FR 70CM (CATHETERS) ×2 IMPLANT
CELLS DAT CNTRL 66122 CELL SVR (MISCELLANEOUS) IMPLANT
CHLORAPREP W/TINT 26 (MISCELLANEOUS) ×2 IMPLANT
CLIP APPLIE 5 13 M/L LIGAMAX5 (MISCELLANEOUS) IMPLANT
CLIP APPLIE ROT 10 11.4 M/L (STAPLE) IMPLANT
CLIP VESOLOCK LG 6/CT PURPLE (CLIP) IMPLANT
CLIP VESOLOCK MED LG 6/CT (CLIP) IMPLANT
CLOTH BEACON ORANGE TIMEOUT ST (SAFETY) ×2 IMPLANT
COVER SURGICAL LIGHT HANDLE (MISCELLANEOUS) ×4 IMPLANT
COVER TIP SHEARS 8 DVNC (MISCELLANEOUS) ×1 IMPLANT
COVER TIP SHEARS 8MM DA VINCI (MISCELLANEOUS) ×1
COVER WAND RF STERILE (DRAPES) IMPLANT
DECANTER SPIKE VIAL GLASS SM (MISCELLANEOUS) ×2 IMPLANT
DERMABOND ADVANCED (GAUZE/BANDAGES/DRESSINGS) ×1
DERMABOND ADVANCED .7 DNX12 (GAUZE/BANDAGES/DRESSINGS) ×1 IMPLANT
DEVICE TROCAR PUNCTURE CLOSURE (ENDOMECHANICALS) IMPLANT
DRAIN CHANNEL 19F RND (DRAIN) ×2 IMPLANT
DRAPE ARM DVNC X/XI (DISPOSABLE) ×4 IMPLANT
DRAPE COLUMN DVNC XI (DISPOSABLE) ×1 IMPLANT
DRAPE DA VINCI XI ARM (DISPOSABLE) ×4
DRAPE DA VINCI XI COLUMN (DISPOSABLE) ×1
DRAPE SURG IRRIG POUCH 19X23 (DRAPES) ×2 IMPLANT
DRSG OPSITE POSTOP 4X10 (GAUZE/BANDAGES/DRESSINGS) IMPLANT
DRSG OPSITE POSTOP 4X6 (GAUZE/BANDAGES/DRESSINGS) ×2 IMPLANT
DRSG OPSITE POSTOP 4X8 (GAUZE/BANDAGES/DRESSINGS) IMPLANT
DRSG TEGADERM 2-3/8X2-3/4 SM (GAUZE/BANDAGES/DRESSINGS) ×10 IMPLANT
DRSG TEGADERM 4X4.75 (GAUZE/BANDAGES/DRESSINGS) ×2 IMPLANT
ELECT REM PT RETURN 15FT ADLT (MISCELLANEOUS) ×2 IMPLANT
ENDOLOOP SUT PDS II  0 18 (SUTURE)
ENDOLOOP SUT PDS II 0 18 (SUTURE) IMPLANT
EVACUATOR SILICONE 100CC (DRAIN) ×2 IMPLANT
GAUZE SPONGE 2X2 8PLY STRL LF (GAUZE/BANDAGES/DRESSINGS) ×1 IMPLANT
GAUZE SPONGE 4X4 12PLY STRL (GAUZE/BANDAGES/DRESSINGS) IMPLANT
GLOVE BIO SURGEON STRL SZ7.5 (GLOVE) ×6 IMPLANT
GLOVE BIOGEL M STRL SZ7.5 (GLOVE) ×2 IMPLANT
GLOVE INDICATOR 8.0 STRL GRN (GLOVE) ×6 IMPLANT
GOWN STRL REUS W/TWL XL LVL3 (GOWN DISPOSABLE) ×12 IMPLANT
GRASPER SUT TROCAR 14GX15 (MISCELLANEOUS) IMPLANT
GUIDEWIRE ANG ZIPWIRE 038X150 (WIRE) IMPLANT
GUIDEWIRE STR DUAL SENSOR (WIRE) ×2 IMPLANT
HOLDER FOLEY CATH W/STRAP (MISCELLANEOUS) ×2 IMPLANT
KIT PROCEDURE DA VINCI SI (MISCELLANEOUS)
KIT PROCEDURE DVNC SI (MISCELLANEOUS) IMPLANT
KIT TURNOVER KIT A (KITS) IMPLANT
MANIFOLD NEPTUNE II (INSTRUMENTS) ×2 IMPLANT
NEEDLE INSUFFLATION 14GA 120MM (NEEDLE) ×2 IMPLANT
PACK CARDIOVASCULAR III (CUSTOM PROCEDURE TRAY) ×2 IMPLANT
PACK COLON (CUSTOM PROCEDURE TRAY) ×2 IMPLANT
PACK CYSTO (CUSTOM PROCEDURE TRAY) ×2 IMPLANT
PAD POSITIONING PINK XL (MISCELLANEOUS) ×2 IMPLANT
PENCIL SMOKE EVACUATOR (MISCELLANEOUS) IMPLANT
PORT LAP GEL ALEXIS MED 5-9CM (MISCELLANEOUS) ×2 IMPLANT
PROTECTOR NERVE ULNAR (MISCELLANEOUS) ×4 IMPLANT
RELOAD STAPLER 3.5X45 BLU DVNC (STAPLE) IMPLANT
RELOAD STAPLER 4.3X45 GRN DVNC (STAPLE) IMPLANT
RELOAD STAPLER 4.3X60 GRN DVNC (STAPLE) ×2 IMPLANT
RTRCTR WOUND ALEXIS 18CM MED (MISCELLANEOUS)
SCISSORS LAP 5X35 DISP (ENDOMECHANICALS) IMPLANT
SEAL CANN UNIV 5-8 DVNC XI (MISCELLANEOUS) ×4 IMPLANT
SEAL XI 5MM-8MM UNIVERSAL (MISCELLANEOUS) ×4
SEALER VESSEL DA VINCI XI (MISCELLANEOUS) ×1
SEALER VESSEL EXT DVNC XI (MISCELLANEOUS) ×1 IMPLANT
SET IRRIG TUBING LAPAROSCOPIC (IRRIGATION / IRRIGATOR) ×2 IMPLANT
SLEEVE ADV FIXATION 5X100MM (TROCAR) IMPLANT
SOLUTION ELECTROLUBE (MISCELLANEOUS) ×2 IMPLANT
SPONGE GAUZE 2X2 STER 10/PKG (GAUZE/BANDAGES/DRESSINGS) ×1
STAPLER 60 DA VINCI SURE FORM (STAPLE) ×1
STAPLER 60 SUREFORM DVNC (STAPLE) ×1 IMPLANT
STAPLER CANNULA SEAL DVNC XI (STAPLE) ×1 IMPLANT
STAPLER CANNULA SEAL XI (STAPLE) ×1
STAPLER RELOAD 3.5X45 BLU DVNC (STAPLE)
STAPLER RELOAD 3.5X45 BLUE (STAPLE)
STAPLER RELOAD 4.3X45 GREEN (STAPLE)
STAPLER RELOAD 4.3X45 GRN DVNC (STAPLE)
STAPLER RELOAD 4.3X60 GREEN (STAPLE) ×2
STAPLER RELOAD 4.3X60 GRN DVNC (STAPLE) ×2
STAPLER SHEATH (SHEATH) ×1
STAPLER SHEATH ENDOWRIST DVNC (SHEATH) ×1 IMPLANT
STOPCOCK 4 WAY LG BORE MALE ST (IV SETS) ×4 IMPLANT
SURGILUBE 2OZ TUBE FLIPTOP (MISCELLANEOUS) ×2 IMPLANT
SUT MNCRL AB 4-0 PS2 18 (SUTURE) ×2 IMPLANT
SUT PDS AB 1 CT1 27 (SUTURE) IMPLANT
SUT PDS AB 1 TP1 96 (SUTURE) IMPLANT
SUT PROLENE 0 CT 2 (SUTURE) IMPLANT
SUT PROLENE 2 0 KS (SUTURE) ×2 IMPLANT
SUT PROLENE 2 0 SH DA (SUTURE) IMPLANT
SUT SILK 2 0 (SUTURE)
SUT SILK 2 0 SH CR/8 (SUTURE) IMPLANT
SUT SILK 2-0 18XBRD TIE 12 (SUTURE) IMPLANT
SUT SILK 3 0 (SUTURE) ×1
SUT SILK 3 0 SH CR/8 (SUTURE) ×2 IMPLANT
SUT SILK 3-0 18XBRD TIE 12 (SUTURE) ×1 IMPLANT
SUT V-LOC BARB 180 2/0GR6 GS22 (SUTURE)
SUT VIC AB 3-0 SH 18 (SUTURE) IMPLANT
SUT VIC AB 3-0 SH 27 (SUTURE)
SUT VIC AB 3-0 SH 27XBRD (SUTURE) IMPLANT
SUT VICRYL 0 UR6 27IN ABS (SUTURE) ×2 IMPLANT
SUTURE V-LC BRB 180 2/0GR6GS22 (SUTURE) IMPLANT
SYR 10ML LL (SYRINGE) ×2 IMPLANT
SYS LAPSCP GELPORT 120MM (MISCELLANEOUS)
SYSTEM LAPSCP GELPORT 120MM (MISCELLANEOUS) IMPLANT
TAPE UMBILICAL COTTON 1/8X30 (MISCELLANEOUS) ×2 IMPLANT
TOWEL OR NON WOVEN STRL DISP B (DISPOSABLE) ×2 IMPLANT
TRAY FOLEY MTR SLVR 16FR STAT (SET/KITS/TRAYS/PACK) ×2 IMPLANT
TROCAR ADV FIXATION 5X100MM (TROCAR) ×2 IMPLANT
TUBING CONNECTING 10 (TUBING) ×6 IMPLANT
TUBING INSUFFLATION 10FT LAP (TUBING) ×2 IMPLANT

## 2020-06-29 NOTE — H&P (Addendum)
CC: Referred by Dr. Therisa Doyne for hx of diverticulitis  HPI: Ms. Cuthbert is a pleasant 54yoF with hx of tobacco use, seborrheic dermatitis - presents to our office as a referral from Dr. Therisa Doyne for evaluation of diverticulitis, recently. She began having some sharp intermittent left lower quadrant abdominal pains a few months ago. She denies fever or chills. She denies nausea or vomiting. She ultimately was seen in gastroenterology and underwent CT scan which demonstrated a markedly thickened segment of sigmoid. It was an adjacent irregular 4.5 cm fluid collection and "possible" extraluminal locules of gas. Differential including perisigmoidal abscess with microperforation or neoplasm. She was started on Augmentin. Gastroenterology has declined endoscopic evaluation of this region until it is removed surgically.She reports dramatic improvement since beginning this and currently has no abdominal pain whatsoever. She continues to deny any fever/chills/nausea/vomiting. She is having bowel movements. She denies abdominal cramping or distention. She denies ever having had this before. She believes her last colonoscopy was about 5 or 7 years ago and believes a polyp was removed. Previously scheduled for surgery but rescheduled due to staffing issues.  She had interval CT abd/pelvis that has shown radiographic improvement in findings as they pertain to her sigmoid colon including improvements in ureteral findings - all suggesting inflammatory component causing this as opposed to a progressive finding which would be more worrisome for malignancy.  She has taken her bowel prep. She denies changes in her health or health history. Initially stated she had quit tobacco products for >2 months now but appears she has cut back to 2-3 cigarettes per day but still using.  PMH: Tobacco use (uncontrolled); seborrheic dermatitis (well controlled with shampoo)  PSH: Open appendectomy as a child. She denies any  other abdominal surgical history.  FHx: Denies FHx of malignancy  Social: Active tobacco use-one pack per day. She reports she drinks about 2 glasses of red wine per night. She denies illicit drug use. 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  Past Medical History:  Diagnosis Date  . Cancer (HCC)    Skin -nose  . Elevated cholesterol   . Emphysema lung ([place])   . Lump in chest    end or stunum  . Tobacco user   . Vitamin D deficiency     Past Surgical History:  Procedure Laterality Date  . APPENDECTOMY    . COLONOSCOPY  2010    Family History  Problem Relation Age of Onset  . Dementia Mother   . Heart failure Father   . Canavan disease Maternal Grandmother        BREAST  . Cancer Paternal Grandmother        INTESTINAL    Social:  reports that she has been smoking cigarettes. She has a 26.00 pack-year smoking history. She has never used smokeless tobacco. She reports previous alcohol use of about 7.0 standard drinks of alcohol per week. She reports that she does not use drugs.  Allergies:  Allergies  Allergen Reactions  . Flagyl [Metronidazole] Hives    Medications: I have reviewed the patient's current medications.  No results found for this or any previous visit (from the past 48 hour(s)).  No results found.  ROS - all of the below systems have been reviewed with the patient and positives are indicated with  bold text General: chills, fever or night sweats Eyes: blurry vision or double vision ENT: epistaxis or sore throat Allergy/Immunology: itchy/watery eyes or nasal congestion Hematologic/Lymphatic: bleeding problems, blood clots or swollen lymph nodes Endocrine: temperature intolerance or unexpected weight changes Breast: new or changing breast lumps or  nipple discharge Resp: cough, shortness of breath, or wheezing CV: chest pain or dyspnea on exertion GI: as per HPI GU: dysuria, trouble voiding, or hematuria MSK: joint pain or joint stiffness Neuro: TIA or stroke symptoms Derm: pruritus and skin lesion changes Psych: anxiety and depression  PE There were no vitals taken for this visit. Constitutional: NAD; conversant Eyes: Moist conjunctiva; no lid lag; pupils equal and round Neck: Trachea midline; no thyromegaly Lungs: Normal respiratory effort; no tactile fremitus CV: RRR; no palpable thrills; no pitting edema GI: Abd soft, NT/ND; no palpable hepatosplenomegaly MSK: Normal range of motion of extremities; no clubbing/cyanosis Psychiatric: Appropriate affect; alert and oriented x3 Lymphatic: No palpable cervical or axillary lymphadenopathy  No results found for this or any previous visit (from the past 48 hour(s)).  No results found.   A/P: Ms. Clinch is a pleasant 29yoF with hx of tobacco use, seborrheic dermatitis - here for evaluation of 1st episode of diverticulitis - with adjacent collection, 4.5 cm. That said, she was had dramatic improvement on oral antibiotics and is currently symptom/pain-free. She has no fevers. She denies any abdominal pain.  -She has ongoing weight loss and smoldering type symptoms at this point, at least radiographically. She has requested to proceed with surgery. Gastroenterology does not feel it is safe for her to undergo colonoscopy due to CT findings although they are improving. This would also speak against malignancy which would expect more progressive findings and not improvement. Given ongoing wt loss despite good appetite and eating large meals, do not think that delays in surgery will result in any sort of improvement for her or risk reduction with time. -The anatomy and physiology of the GI tract was discussed at length with the patient. The pathophysiology of diverticulitis was discussed at  length with associated pictures as well. -We have reviewed robotic and potential open techniques to sigmoidectomy; flexible sigmoidoscopy; cystoscopy/ureteral stents (urology). -The planned procedures, material risks (including, but not limited to, pain, bleeding, infection, scarring, need for blood transfusion, damage to surrounding structures- blood vessels/nerves/viscus/organs, damage to ureter, urine leak, leak from anastomosis, need for additional procedures, need for stoma which may be permanent, worsening of pre-existing medical conditions, DVT/PE, hernia, recurrence, pneumonia, heart attack, stroke, death) benefits and alternatives to surgery were discussed at length. I noted a good probability that the procedure would help improve their symptoms. The patient's questions were answered to her satisfaction, she voiced understanding and has elected to proceed with surgery. Additionally, we discussed typical postoperative expectations and the recovery process.  Sharon Mt. Dema Severin, M.D. [facility]

## 2020-06-29 NOTE — Consult Note (Signed)
H&P  Chief Complaint: Diverticulitis with pelvic fluid collection  History of Present Illness: Olivia Gomez is a 74 year old female with diverticulitis and pelvic fluid collection.  She is undergoing robotic potential open sigmoidectomy today and Dr. Dema Severin requested cystoscopy with ureteral stent placement and retrograde injection of firefly fluorescent dye.  Patient denies any prior urologic history.  She has had no dysuria or gross hematuria.  No urinary complaints.  Past Medical History:  Diagnosis Date  . Cancer (HCC)    Skin -nose  . Elevated cholesterol   . Emphysema lung ([place])   . Lump in chest    end or stunum  . Tobacco user   . Vitamin D deficiency    Past Surgical History:  Procedure Laterality Date  . APPENDECTOMY    . COLONOSCOPY  2010    Home Medications:  Medications Prior to Admission  Medication Sig Dispense Refill Last Dose  . Ascorbic Acid (VITAMIN C) 1000 MG tablet Take 1,000 mg by mouth once a week.   Past Week at Unknown time  . aspirin EC 81 MG tablet Take 81 mg by mouth daily in the afternoon.    Past Week at Unknown time  . Cholecalciferol (D3) 50 MCG (2000 UT) TABS Take 2,000 Units by mouth daily.   Past Week at Unknown time  . COD LIVER OIL PO Take 415 mg by mouth daily in the afternoon.   Past Week at Unknown time  . Coenzyme Q10 (COQ10) 100 MG CAPS Take 100 mg by mouth once a week.   Past Week at Unknown time  . MAGNESIUM CITRATE PO Take 150 mg by mouth once a week.   Past Week at Unknown time  . Omega-3 Fatty Acids (FISH OIL) 1200 MG CAPS Take 1,200 mg by mouth daily.   Past Week at Unknown time  . saccharomyces boulardii (FLORASTOR) 250 MG capsule Take 250 mg by mouth 2 (two) times daily.   Past Week at Unknown time  . SUPER B COMPLEX/C PO Take 1 capsule by mouth once a week.   Past Week at Unknown time  . Turmeric 500 MG CAPS Take 500 mg by mouth once a week.   Past Week at Unknown time  . Zinc 50 MG TABS Take 50 mg by mouth daily in the afternoon.      . neomycin (MYCIFRADIN) 500 MG tablet Take 500 mg by mouth in the morning, at noon, and at bedtime. (Patient not taking: Reported on 06/11/2020)   Not Taking at Unknown time   Allergies:  Allergies  Allergen Reactions  . Flagyl [Metronidazole] Hives    Family History  Problem Relation Age of Onset  . Dementia Mother   . Heart failure Father   . Canavan disease Maternal Grandmother        BREAST  . Cancer Paternal Grandmother        INTESTINAL   Social History:  reports that she has been smoking cigarettes. She has a 26.00 pack-year smoking history. She has never used smokeless tobacco. She reports previous alcohol use of about 7.0 standard drinks of alcohol per week. She reports that she does not use drugs.  ROS: A complete review of systems was performed.  All systems are negative except for pertinent findings as noted. Review of Systems  All other systems reviewed and are negative.    Physical Exam:  Vital signs in last 24 hours:   General:  Alert and oriented, No acute distress HEENT: Normocephalic, atraumatic Cardiovascular: Regular rate  and rhythm Lungs: Regular rate and effort Abdomen: Soft, nontender, nondistended, no abdominal masses Back: No CVA tenderness Extremities: No edema Neurologic: Grossly intact  Laboratory Data:  No results found for this or any previous visit (from the past 24 hour(s)). Recent Results (from the past 240 hour(s))  SARS CORONAVIRUS 2 (TAT 6-24 HRS) Nasopharyngeal Nasopharyngeal Swab     Status: None   Collection Time: 06/26/20  2:31 PM   Specimen: Nasopharyngeal Swab  Result Value Ref Range Status   SARS Coronavirus 2 NEGATIVE NEGATIVE Final    Comment: (NOTE) SARS-CoV-2 target nucleic acids are NOT DETECTED.  The SARS-CoV-2 RNA is generally detectable in upper and lower respiratory specimens during the acute phase of infection. Negative results do not preclude SARS-CoV-2 infection, do not rule out co-infections with other  pathogens, and should not be used as the sole basis for treatment or other patient management decisions. Negative results must be combined with clinical observations, patient history, and epidemiological information. The expected result is Negative.  Fact Sheet for Patients: SugarRoll.be  Fact Sheet for Healthcare Providers: https://www.woods-mathews.com/  This test is not yet approved or cleared by the [place] FDA and  has been authorized for detection and/or diagnosis of SARS-CoV-2 by FDA under an Emergency Use Authorization (EUA). This EUA will remain  in effect (meaning this test can be used) for the duration of the COVID-19 declaration under Se ction 564(b)(1) of the Act, 21 U.S.C. section 360bbb-3(b)(1), unless the authorization is terminated or revoked sooner.  Performed at [facility], [place] [address], [place], [place] [address]    Creatinine: No results for input(s): CREATININE in the last 168 hours.  Impression/Assessment:  diverticulitis - with adjacent collection, 4.5 cm.   Plan:  Dr. Dema Severin planning robotic and potential open techniques to sigmoidectomy; flexible sigmoidoscopy.   I discussed with the patient the nature, potential benefits, risks and alternatives to cystoscopy, retrograde injection of firefly and ureteral stent placement, including side effects of the proposed treatment, the likelihood of the patient achieving the goals of the procedure, and any potential problems that might occur during the procedure or recuperation. All questions answered. Patient elects to proceeed.     Festus Aloe 06/29/2020, 10:16 AM

## 2020-06-29 NOTE — Op Note (Signed)
PATIENT: Olivia Gomez  74 y.o. female  Patient Care Team: Marda Stalker, PA-C as PCP - General (Family Medicine) Belva Crome, MD as PCP - Cardiology (Cardiology)  PREOP DIAGNOSIS: DIVERTICULITIS  POSTOP DIAGNOSIS: DIVERTICULITIS  PROCEDURE:  1. Robotic low anterior resection with double stapled coloproctostomy 2. Lysis of adhesions x 45 minutes 3. Flexible sigmoidoscopy  SURGEON: Sharon Mt. Dema Severin, MD  ASSISTANT: Michael Boston, MD  ANESTHESIA: General endotracheal  EBL: 100 mL Total I/O In: 1500 [I.V.:1400; IV Piggyback:100] Out: 500 [Urine:400; Blood:100]  DRAINS: None  SPECIMEN: 1. Rectosigmoid colon - open end proximal 2. Distal donut  COUNTS: Sponge, needle and instrument counts were reported correct x2  FINDINGS: Adhesions in right lower quadrant likely from remote open appendectomy. Lysis carried out carefully. Densely adherent sigmoid colon to the left lower quadrant and pelvic sidewall.  Abscess cavity extending from the pelvic sidewall into the proximal portion of the pelvis.  This therefore necessitated a low anterior resection to get below the level of disease and to healthy rectum.  29 mm EEA colorectal anastomosis fashioned 14 cm from the anal verge by flexible sigmoidoscopy. This is well perfused, tension free, air tight and hemostatic.  STATEMENT OF MEDICAL NECESSITY: Olivia Gomez is a 74 y.o. female with history of tobacco use and seborrheic dermatitis whom presented to our office as a referral from Dr. Therisa Doyne for evaluation of presumed diverticulitis.  She began having some sharp intermittent left lower quadrant pains a few months before.  She ultimately was seen by GI and had a CT scan that showed a markedly thickened segment of sigmoid with an adjacent 4-1/2 cm irregular collection and possible extraluminal locules of gas.  She was given Augmentin and slowly improved.  GI had declined endoscopic evaluation until this area was removed surgically  for fears of perforation.  She did clinically improve.  Her colonoscopy prior to all of this was 5 to 7 years ago.  She did have a repeat CT scan done earlier this month that demonstrated significant improvement all the findings including the amount of inflammation around the left ureter.  The dilated left ureter had also improved.  We discussed options going forward on more than one occasion.  She opted to proceed with surgery.  Please refer to notes elsewhere for details regarding discussions.  NARRATIVE: Informed consent was verified. She was taken to the operating room, placed supine on the operating table and SCD's were applied. General endotracheal anesthesia was induced without difficulty. The patient was then positioned in the lithotomy position with Allen stirrups. Pressure points were then padded.   Urology then scrubbed for their portion of the procedure.  Please refer to Dr. Lyndal Rainbow note for details regarding this portion of the procedure.  Hair on the abdomen was clipped.  The abdomen was then prepped and draped in the standard sterile fashion. Surgical timeout was called indicating the correct patient, procedure, positioning and need for preoperative antibiotics.   An OG tube was placed by anesthesia and confirmed to be to suction.  At Palmer's point, a stab incision was created and the Veress needle was introduced into the peritoneal cavity on the first attempt.  Intraperitoneal location was confirmed by the aspiration and saline drop test.  Pneumoperitoneum was established to a maximum pressure of 15 mmHg using CO2.  Following this, the abdomen was marked for planned trocar sites.  Just to the right and cephalad to the umbilicus, an 8 mm incision was created and an 8 mm blunt  tipped robotic trocar was cautiously placed into the peritoneal cavity.  The laparoscope was inserted and demonstrated no evidence of trocar site nor Veress needle site complications.  The Veress needle was removed.   Bilateral transversus abdominis plane blocks were then created using a dilute mixture of Exparel with Marcaine.  3 additional 8 mm robotic trochars were placed under direct visualization roughly in a line extending from the right ASIS towards the left upper quadrant. The bladder was inspected and noted to be at/below the pubic symphysis.  Staying 3 fingerbreadths above the pubic symphysis, an incision was created and the 12 mm robotic trocar inserted directed cephalad into the peritoneal cavity under direct visualization.  An additional 5 mm assist port was placed in the right lateral abdomen under direct visualization.  The abdomen was surveyed and there was a densely adherent/fibrotic sigmoid colon the left lower quadrant.  There were also omental and small bowel containing adhesions to her right lower quadrant consistent with her prior open appendectomy.  These were carefully lysed laparoscopically staying away from the serosa.  Small bowel was inspected and noted to be free of injury at completion.  The omentum was hemostatic. She was positioned in Trendelenburg with some left side up.  Small bowel was carefully retracted out of the pelvis.  The robot was then docked and I went to the console.   The sigmoid colon was readily identified.    Findings as noted above.  There is no apparent mass or other findings to suggest a malignancy.  Attachments of the sigmoid colon were taken down from the intersigmoid fossa.  The rectosigmoid colon was grasped and elevated anteriorly.  Beginning with a medial to lateral approach, the peritoneum overlying the presacral space was carefully incised.  The TME plane was readily gained working in a plane between the fascia propria of the rectum and the presacral fascia.  Hypogastric nerves were seen going along the the presacral fascia and were protected free of injury.  Working more proximally, the mesorectum and sigmoid mesentery were carefully mobilized off of the peritoneum.   The left ureter was identified and protected free of injury.  The left gonadal vessels were identified and protected.  These were both swept "down."  The superior hemorrhoidal and IMA pedicles were identified. Further mesocolon was mobilized proximally staying in this plane between the retroperitoneum proper and the mesocolon. Attention was then turned to the lateral portion of dissection.  The sigmoid colon was retracted to the right.  There were some dense adhesions from her prior diverticulitis including abscess cavities on the left pelvic sidewall that was carefully taken away sharply using robotic scissors.  The left ureter was identified during this portion of the dissection from both the medial lateral approaches.  This was protected free of injury.  The abscess cavity did run right on top of the left ureter.  We were able to stay in a plane above this however and safely dissected away. The sigmoid colon was fully mobilized and working proximal to the diseased segment of colon, the descending colon was mobilized by incising the Adarius Tigges line of Toldt.  This was done all the way up to the level of the splenic flexure.  The associated mesocolon was also mobilized medially.  The left ureter again was confirmed to be well away for plan dissection and well away from the vasculature which had been dissected medially.  The rectosigmoid colon was elevated anteriorly. The left ureter was re-identified. The IMA was clear of  this. The IMA was then divided with the vessel sealer. The stump was inspected and noted to be completely hemostatic with a good seal.  The mesentery was divided out to the point of planned proximal division.  Working more distally, the rectum was identified where the tinea had splayed and there were loss of appendices epiploica.  This also corresponded to a location overlying the sacral promontory.  Anatomically, this clearly represents the proximal rectum.  The mesentery out to this level was then  cleared using the vessel sealer. The distal point of transection on the proximal rectum was identified.   A 60 mm green load robotic stapler was then placed through the 12 mm port and introduced into the peritoneal cavity.  The rectum was divided.  The stump was intact and healthy in appearance.   Attention was turned to performing a perfusion test. ICG was administered by anesthesia and at the level of the cleared mesentery proximally, there was excellent uptake of the tracer.  The rectum was also well perfused in appearance.  There was a visible pulse in the mesentery out to the level of the cleared colon at the level of the proximal sigmoid/descending colon junction.  This colon is also supple and healthy in appearance without any thickening.  This reached into the pelvis without any difficulty and remained in that location without any tension. A locking grasper was then placed on the sigmoid staple line.   Attention was turned to the extracorporeal portion of the procedure.  The robot was undocked.  I scrubbed back in.  Using the 12 mm trocar site, a Pfannenstiel incision was created and incorporated the fascial opening through the 12 mm port site.  The rectus fascia was incised and then elevated.  The rectus muscle was mobilized free of the overlying fascia.  The peritoneum was incised in the midline well above the location of the bladder.  An Smyth wound protector was placed.  Towels were placed around the field.  The divided colon was passed through the wound protector.  The point of proximal division was identified and was again on a healthy segment of supple colon with a palpable pulse in the mesentery. This was pink in color.  A pursestring device was applied.  A 2-0 Prolene on a Keith needle was passed.  The colon was divided and passed off with the open end being proximal.  EEA sizers were then introduced and a 29 mm EEA selected.  "Belt loops" consisting of 3-0 silk were placed around the  pursestring suture line.  The anvil was placed and the pursestring tied. This was placed back into the abdomen and a cap placed over the wound protector port site.  Pneumoperitoneum was reestablished.  I then went below to pass the stapler.  My partner remained above.  EEA sizers were cautiously passed trans-anally under direct visualization.  The stapler was passed and the spike deployed just anterior to the staple line.  The components were then mated.  Orientation was confirmed such that there was no twisting of the colon nor small bowel underneath the mesenteric defect. Care was taken to ensure no other structures were incorporated within this either.  The stapler was then closed, held, and fired. This was then removed. The donuts were inspected and noted to be complete. Distal donut passed off as specimen.  The colon proximal to the anastomosis was then gently occluded. The pelvis was filled with sterile irrigation. Under direct visualization, I passed a flexible sigmoidoscope.  The anastomosis was under water.  With good distention of the anastomosis there was no air leak. The anastomosis pink in appearance.  This is located at 14 cm from the anal verge by flexible sigmoidoscopy.  It is hemostatic.  Additionally, looking from above, there is no tension on the colon or mesentery.  Sigmoidoscope was withdrawn.  Irrigation was evacuated from the pelvis.  The abdomen and pelvis are surveyed and noted to be completely hemostatic without any apparent injury.  Under direct visualization, all trochars are removed.  The Joy wound protector was removed.  Gowns/gloves are changed and a fresh set of clean instruments utilized. Additional sterile drapes were placed around the field.   Sponge, needle, and instrument counts were reported correct.  The Pfannenstiel peritoneum was closed with a running 2-0 Vicryl suture.  The rectus fascia was then closed using 2 running #1 PDS sutures.  The fascia was then palpated and  noted to be completely closed.  Additional anesthetic was infiltrated at the Pfannenstiel site.  Sponge, needle, and instrument counts were then again reported correct. 4-0 Monocryl subcuticular suture was used to close the skin of all incision sites.  Dermabond was placed over all incisions.  A honeycomb dressing placed over the Pfannenstiel as well. The stents were removed and intact.   She is taken out of lithotomy, awakened from anesthesia, extubated, and transferred to a stretcher for transport to PACU in satisfactory condition having tolerated the procedure well.

## 2020-06-29 NOTE — Transfer of Care (Signed)
Immediate Anesthesia Transfer of Care Note  Patient: Olivia Gomez  Procedure(s) Performed: ROBOTIC LOW ANTERIOR RESECTION, LYSIS OF ADHESIONS, TAP BLOCK (N/A ) FLEXIBLE SIGMOIDOSCOPY (N/A ) CYSTOSCOPY WITH FIREFLY STENT PLACEMENT (N/A )  Patient Location: PACU  Anesthesia Type:General  Level of Consciousness: sedated  Airway & Oxygen Therapy: Patient Spontanous Breathing and Patient connected to face mask oxygen  Post-op Assessment: Report given to RN and Post -op Vital signs reviewed and stable  Post vital signs: Reviewed and stable  Last Vitals:  Vitals Value Taken Time  BP 147/75 06/29/20 1415  Temp    Pulse 63 06/29/20 1416  Resp 10 06/29/20 1416  SpO2 100 % 06/29/20 1416  Vitals shown include unvalidated device data.  Last Pain:  Vitals:   06/29/20 1020  TempSrc: Oral         Complications: No complications documented.

## 2020-06-29 NOTE — Op Note (Signed)
Preoperative diagnosis: Diverticulitis with adjacent fluid collection Postoperative diagnosis: Diverticulitis with adjacent fluid collection  Procedure: Cystoscopy with retrograde injection of firefly fluorescent dye and ureteral stent placement bilateral; Foley catheter placement  Surgeon: Junious Silk  Anesthesia: General  Indication for procedure: Olivia Gomez is a 74 year old female with diverticulitis and adjacent collection and ureteral stents and retrograde injection of firefly fluorescent dye requested for sigmoidectomy robotic possible open.  Findings: Introitus was normal but stenotic.  Moderate vaginal atrophy.  Meatus slightly retracted.  No mass visualized.  On cystoscopy the urethra and the bladder were unremarkable.  Right retrograde pyelogram-this outlined a single ureter single collecting system unit with a UPJ type appearance with some dilation of the collecting system renal pelvis and narrowing at the UPJ but no filling defect.  Ureter appeared normal.  Left retrograde pyelogram-this outlined single ureter single collecting system unit with some narrowing of the left distal to mid ureter possible slight compression from fluid collection but no significant dilation of the ureter.  Ureter appeared normal without any filling defects.  Somewhat similar to the right but slightly more mild dilation of the collecting system and renal pelvis consistent with a mild UPJ.  No filling defects of the collecting system.  Description of procedure: After consent was obtained patient brought to the operating room.  After adequate anesthesia she placed lithotomy position prepped and draped in the usual sterile fashion.  A timeout was performed to confirm the patient and procedure.  Cystoscope was passed per urethra and the bladder inspected.  The right ureteral orifice was cannulated with a 6 [place] open-ended catheter and retrograde injection of contrast with firefly fluorescent dye was performed.  Wire  was advanced up into the collecting system and the 6 French catheter advanced into the renal pelvis and the wire withdrawn.  The open-ended catheter was backed out slightly and then the scope disassembled and removed leaving the catheter in place.  Similarly the left ureteral orifice was cannulated with a 6 [place] open-ended catheter and left retrograde injection of contrast with firefly fluorescent dye was performed.  Wire was advanced to the pelvis and the stent advanced to the UPJ.  The wire was removed and the scope disassembled and removed leaving the stent in place.  A 16 French Foley catheter was placed and then the stents were then secured to the catheter with a silk tie and then the adapter used to drain the stents and Foley through the Foley bag.  The patient was then turned over to Dr. Dema Severin for his procedure.  Complications: None  Blood loss: Minimal  Specimens: None  Drains: Bilateral externalized 6 French open-ended ureteral catheters and a 16 French Foley catheter to drainage  Disposition: Under the care of Dr. Dema Severin for his procedure.

## 2020-06-29 NOTE — Consult Note (Signed)
[place] Nurse ostomy consult note  [facility] Nurse requested for preoperative stoma site marking by Dr. Dema Severin.  This marking was performed by my associate, D. Barbie Haggis on 9/1.  [place] nursing team  will remain available to this patient, the nursing, surgical and medical teams in the event a stoma is created intraoperatively.   Thanks, Maudie Flakes, MSN, RN, [place], Arther Abbott  Pager# [phone]

## 2020-06-29 NOTE — Anesthesia Postprocedure Evaluation (Signed)
Anesthesia Post Note  Patient: KEAISHA SUBLETTE  Procedure(s) Performed: ROBOTIC LOW ANTERIOR RESECTION, LYSIS OF ADHESIONS, TAP BLOCK (N/A ) FLEXIBLE SIGMOIDOSCOPY (N/A ) CYSTOSCOPY WITH FIREFLY STENT PLACEMENT (N/A )     Patient location during evaluation: PACU Anesthesia Type: General Level of consciousness: sedated Pain management: pain level controlled Vital Signs Assessment: post-procedure vital signs reviewed and stable Respiratory status: spontaneous breathing and respiratory function stable Cardiovascular status: stable Postop Assessment: no apparent nausea or vomiting Anesthetic complications: no   No complications documented.  Last Vitals:  Vitals:   06/29/20 1530 06/29/20 1545  BP: (!) 166/70   Pulse: 64 (!) 55  Resp: 14 18  Temp:    SpO2: 100% (!) 82%    Last Pain:  Vitals:   06/29/20 1545  TempSrc:   PainSc: 5                  Esthela Brandner DANIEL

## 2020-06-29 NOTE — Anesthesia Procedure Notes (Signed)
Procedure Name: Intubation Date/Time: 06/29/2020 10:22 AM Performed by: Talbot Grumbling, CRNA Pre-anesthesia Checklist: Patient identified, Emergency Drugs available, Suction available and Patient being monitored Patient Re-evaluated:Patient Re-evaluated prior to induction Oxygen Delivery Method: Circle system utilized Preoxygenation: Pre-oxygenation with 100% oxygen Induction Type: IV induction Ventilation: Mask ventilation without difficulty Laryngoscope Size: Mac and 3 Grade View: Grade I Tube type: Oral Tube size: 7.0 mm Number of attempts: 1 Airway Equipment and Method: Stylet Placement Confirmation: ETT inserted through vocal cords under direct vision,  positive ETCO2 and breath sounds checked- equal and bilateral Secured at: 21 cm Tube secured with: Tape Dental Injury: Teeth and Oropharynx as per pre-operative assessment

## 2020-06-30 ENCOUNTER — Encounter (HOSPITAL_COMMUNITY): Payer: Self-pay | Admitting: Surgery

## 2020-06-30 LAB — CBC
HCT: 31.4 % — ABNORMAL LOW (ref 36.0–46.0)
Hemoglobin: 10.4 g/dL — ABNORMAL LOW (ref 12.0–15.0)
MCH: 30.7 pg (ref 26.0–34.0)
MCHC: 33.1 g/dL (ref 30.0–36.0)
MCV: 92.6 fL (ref 80.0–100.0)
Platelets: 207 10*3/uL (ref 150–400)
RBC: 3.39 MIL/uL — ABNORMAL LOW (ref 3.87–5.11)
RDW: 14.5 % (ref 11.5–15.5)
WBC: 15.9 10*3/uL — ABNORMAL HIGH (ref 4.0–10.5)
nRBC: 0 % (ref 0.0–0.2)

## 2020-06-30 LAB — BASIC METABOLIC PANEL
Anion gap: 10 (ref 5–15)
BUN: 7 mg/dL — ABNORMAL LOW (ref 8–23)
CO2: 26 mmol/L (ref 22–32)
Calcium: 8.7 mg/dL — ABNORMAL LOW (ref 8.9–10.3)
Chloride: 98 mmol/L (ref 98–111)
Creatinine, Ser: 0.72 mg/dL (ref 0.44–1.00)
GFR calc Af Amer: >60 mL/min (ref >60)
GFR calc non Af Amer: >60 mL/min (ref >60)
Glucose, Bld: 106 mg/dL — ABNORMAL HIGH (ref 70–99)
Potassium: 3.8 mmol/L (ref 3.5–5.1)
Sodium: 134 mmol/L — ABNORMAL LOW (ref 135–145)

## 2020-06-30 NOTE — Evaluation (Signed)
Occupational Therapy Evaluation Patient Details Name: Olivia Gomez MRN: [id number] DOB: September 22, 1946 Today's Date: 06/30/2020    History of Present Illness Pt admitted with abd pain 2* diverticulitis and now s/p low anterior resection, lysis of adhesions. and ureteral stent placement.     Clinical Impression   Olivia Gomez is a 74 year old woman with above medical history who presents with functional upper body strength and demonstrates ability to perform functional mobility and ADLs. Patient ambulated in room pushing IV pole with min guard for safety due to patient ambulating with wide base of support due to catheter - but no loss of balance or unsafe movement. Patient able to donn lower body clothing, stand at sink, ambulate to bathroom and demonstrates physical abilities to perform toileting without pain limiting her functional abilities. No OT needs at this time.    Follow Up Recommendations  No OT follow up    Equipment Recommendations  None recommended by OT    Recommendations for Other Services       Precautions / Restrictions Precautions Precautions: Fall Restrictions Weight Bearing Restrictions: No      Mobility Bed Mobility Overal bed mobility: Needs Assistance Bed Mobility: Supine to Sit     Supine to sit: Supervision     General bed mobility comments: Patient seated in chair when therapist entered room.  Transfers Overall transfer level: Needs assistance   Transfers: Sit to/from Stand Sit to Stand: Supervision         General transfer comment: Min guard for ambulation in room.    Balance Overall balance assessment: Mild deficits observed, not formally tested                                         ADL either performed or assessed with clinical judgement   ADL Overall ADL's : Modified independent                                       General ADL Comments: Modified independent to perform ADLs secondary to  catheter and IV pole. Patient able to donn socks and shoes, stand at sink for grooming, ambulate to bathroom for toileting.     Vision Baseline Vision/History: Wears glasses Wears Glasses: Reading only       Perception     Praxis      Pertinent Vitals/Pain Pain Assessment: Faces Pain Score: 3  Faces Pain Scale: Hurts a little bit Pain Location: abdominal pain Pain Descriptors / Indicators: Sore;Aching Pain Intervention(s): Monitored during session     Hand Dominance     Extremity/Trunk Assessment Upper Extremity Assessment Upper Extremity Assessment: Overall WFL for tasks assessed   Lower Extremity Assessment Lower Extremity Assessment: Defer to PT evaluation   Cervical / Trunk Assessment Cervical / Trunk Assessment: Normal   Communication Communication Communication: No difficulties;HOH   Cognition Arousal/Alertness: Awake/alert Behavior During Therapy: WFL for tasks assessed/performed Overall Cognitive Status: Within Functional Limits for tasks assessed                                     General Comments       Exercises     Shoulder Instructions      Home Living Family/patient expects to  be discharged to:: Private residence Living Arrangements: Alone Available Help at Discharge: Family Type of Home: House Home Access: Stairs to enter Technical brewer of Steps: 1   Home Layout: Able to live on main level with bedroom/bathroom               Home Equipment: Walker - standard          Prior Functioning/Environment Level of Independence: Independent                 OT Problem List:        OT Treatment/Interventions:      OT Goals(Current goals can be found in the care plan section) Acute Rehab OT Goals Patient Stated Goal: Regain IND OT Goal Formulation: All assessment and education complete, DC therapy  OT Frequency:     Barriers to D/C:            Co-evaluation              AM-PAC OT "6 Clicks"  Daily Activity     Outcome Measure Help from another person eating meals?: None Help from another person taking care of personal grooming?: None Help from another person toileting, which includes using toliet, bedpan, or urinal?: None Help from another person bathing (including washing, rinsing, drying)?: None Help from another person to put on and taking off regular upper body clothing?: None Help from another person to put on and taking off regular lower body clothing?: None 6 Click Score: 24   End of Session Nurse Communication: Mobility status  Activity Tolerance: Patient tolerated treatment well Patient left: in chair;with call bell/phone within reach;with chair alarm set  OT Visit Diagnosis: Muscle weakness (generalized) (M62.81)                Time: 6979-4801 OT Time Calculation (min): 11 min Charges:  OT General Charges $OT Visit: 1 Visit OT Evaluation $OT Eval Low Complexity: 1 Low  Olivia Gomez, OTR/[facility] [place]  Office [phone] Pager: [place] 06/30/2020, 11:49 AM

## 2020-06-30 NOTE — Evaluation (Signed)
Physical Therapy Evaluation Patient Details Name: Olivia Gomez MRN: [id number] DOB: [birth year] Today's Date: 06/30/2020   History of Present Illness  Pt admitted with abd pain 2* diverticulitis and now s/p low anterior resection, lysis of adhesions. and ureteral stent placement.    Clinical Impression  Pt admitted as above and presenting with functional mobility limitations 2* mild balance deficits and abdominal pain.  Pt should progress to dc home with limited assist.    Follow Up Recommendations No PT follow up    Equipment Recommendations  None recommended by PT    Recommendations for Other Services       Precautions / Restrictions Precautions Precautions: Fall Restrictions Weight Bearing Restrictions: No      Mobility  Bed Mobility Overal bed mobility: Needs Assistance Bed Mobility: Supine to Sit     Supine to sit: Supervision     General bed mobility comments: Increased time and supervision but no physical assist   Transfers Overall transfer level: Needs assistance   Transfers: Sit to/from Stand Sit to Stand: Min guard         General transfer comment: steady assist only  Ambulation/Gait Ambulation/Gait assistance: Min assist;Min guard Gait Distance (Feet): 400 Feet Assistive device: None Gait Pattern/deviations: Step-through pattern;Shuffle;Wide base of support     General Gait Details: mild general instability with widened BOS and no LOB; increased stability with increased distance walked  Stairs            Wheelchair Mobility    Modified Rankin (Stroke Patients Only)       Balance Overall balance assessment: Mild deficits observed, not formally tested                                           Pertinent Vitals/Pain Pain Assessment: 0-10 Pain Score: 3  Pain Location: abdominal pain Pain Descriptors / Indicators: Sore Pain Intervention(s): Limited activity within patient's tolerance;Monitored during session     Home Living Family/patient expects to be discharged to:: Private residence Living Arrangements: Alone Available Help at Discharge: Family Type of Home: House Home Access: Stairs to enter   Technical brewer of Steps: 1 Home Layout: Able to live on main level with bedroom/bathroom Home Equipment: Environmental consultant - standard      Prior Function Level of Independence: Independent               Hand Dominance        Extremity/Trunk Assessment   Upper Extremity Assessment Upper Extremity Assessment: Overall WFL for tasks assessed    Lower Extremity Assessment Lower Extremity Assessment: Overall WFL for tasks assessed       Communication   Communication: No difficulties;HOH  Cognition Arousal/Alertness: Awake/alert Behavior During Therapy: WFL for tasks assessed/performed Overall Cognitive Status: Within Functional Limits for tasks assessed                                        General Comments      Exercises     Assessment/Plan    PT Assessment Patient needs continued PT services  PT Problem List Decreased activity tolerance;Decreased balance;Decreased mobility;Pain       PT Treatment Interventions DME instruction;Gait training;Stair training;Functional mobility training;Therapeutic activities;Therapeutic exercise;Patient/family education    PT Goals (Current goals can be found in the Care Plan section)  Acute Rehab PT Goals Patient Stated Goal: Regain IND PT Goal Formulation: With patient Time For Goal Achievement: 07/14/20 Potential to Achieve Goals: Good    Frequency Min 3X/week   Barriers to discharge        Co-evaluation               AM-PAC PT "6 Clicks" Mobility  Outcome Measure Help needed turning from your back to your side while in a flat bed without using bedrails?: None Help needed moving from lying on your back to sitting on the side of a flat bed without using bedrails?: None Help needed moving to and from a  bed to a chair (including a wheelchair)?: A Little Help needed standing up from a chair using your arms (e.g., wheelchair or bedside chair)?: A Little Help needed to walk in hospital room?: A Little Help needed climbing 3-5 steps with a railing? : A Little 6 Click Score: 20    End of Session Equipment Utilized During Treatment: Gait belt Activity Tolerance: Patient tolerated treatment well Patient left: in chair;with call bell/phone within reach Nurse Communication: Mobility status PT Visit Diagnosis: Difficulty in walking, not elsewhere classified (R26.2)    Time: 7322-5672 PT Time Calculation (min) (ACUTE ONLY): 22 min   Charges:   PT Evaluation $PT Eval Low Complexity: 1 Low          [place] Pager [phone] Office [phone]   Deandrea Vanpelt 06/30/2020, 10:45 AM

## 2020-06-30 NOTE — Plan of Care (Signed)
  Problem: Education: Goal: Knowledge of General Education information will improve Description: Including pain rating scale, medication(s)/side effects and non-pharmacologic comfort measures Outcome: Progressing   Problem: Clinical Measurements: Goal: Will remain free from infection Outcome: Progressing   

## 2020-06-30 NOTE — Progress Notes (Signed)
Subjective No acute events. Feeling quite well. In good spirits. Hungry. Denies flatus/bm yet.  Objective: Vital signs in last 24 hours: Temp:  [95.4 F (35.2 C)-98.1 F (36.7 C)] 98 F (36.7 C) (09/11 0800) Pulse Rate:  [54-79] 54 (09/11 0800) Resp:  [9-23] 18 (09/11 0800) BP: (113-166)/(54-88) 121/57 (09/11 0800) SpO2:  [82 %-100 %] 100 % (09/11 0800) Weight:  [45.7 kg] 45.7 kg (09/10 1714) Last BM Date: 06/29/20  Intake/Output from previous day: 09/10 0701 - 09/11 0700 In: 2433.8 [P.O.:120; I.V.:2213.8; IV Piggyback:100] Out: 1250 [Urine:1150; Blood:100] Intake/Output this shift: No intake/output data recorded.  Gen: NAD, comfortable, in good spirits; wearing mask CV: RRR Pulm: Normal work of breathing Abd: Soft, NT/ND. Incisions c/d/i without erythema nor drainage Ext: SCDs in place  Lab Results: CBC  Recent Labs    06/30/20 0337  WBC 15.9*  HGB 10.4*  HCT 31.4*  PLT 207   BMET Recent Labs    06/30/20 0337  NA 134*  K 3.8  CL 98  CO2 26  GLUCOSE 106*  BUN 7*  CREATININE 0.72  CALCIUM 8.7*   PT/INR No results for input(s): LABPROT, INR in the last 72 hours. ABG No results for input(s): PHART, HCO3 in the last 72 hours.  Invalid input(s): PCO2, PO2  Studies/Results:  Anti-infectives: Anti-infectives (From admission, onward)   Start     Dose/Rate Route Frequency Ordered Stop   06/29/20 0945  cefoTEtan (CEFOTAN) 2 g in sodium chloride 0.9 % 100 mL IVPB        2 g 200 mL/hr over 30 Minutes Intravenous On call to O.R. 06/29/20 0941 06/29/20 1055   06/29/20 0945  neomycin (MYCIFRADIN) tablet 1,000 mg  Status:  Discontinued        1,000 mg Oral Every 8 hours 06/29/20 0941 06/29/20 0943   06/29/20 0943  sodium chloride 0.9 % with cefoTEtan (CEFOTAN) ADS Med       Note to Pharmacy: Charmayne Sheer   : cabinet override      06/29/20 0943 06/29/20 1037       Assessment/Plan: Patient Active Problem List   Diagnosis Date Noted  . S/P  laparoscopic-assisted sigmoidectomy 06/29/2020  . Colonic diverticular abscess 03/05/2020   s/p Procedure(s): ROBOTIC LOW ANTERIOR RESECTION, LYSIS OF ADHESIONS, TAP BLOCK FLEXIBLE SIGMOIDOSCOPY CYSTOSCOPY WITH FIREFLY STENT PLACEMENT 06/29/2020  -Reviewed procedure and operative findings. Answered her questions related to this as well -She is doing quite well - we discussed plans for today - ambulate around unit as much as able; advance diet as tolerated -Will remove foley -Continue entereg until reliable return of bowel fxn -PPx: SQH, SCDs   LOS: 1 day   Sharon Mt. Dema Severin, M.D. [facility] Use [email or web address] to contact on call provider

## 2020-07-01 LAB — CBC WITH DIFFERENTIAL/PLATELET
Abs Immature Granulocytes: 0.04 10*3/uL (ref 0.00–0.07)
Basophils Absolute: 0 10*3/uL (ref 0.0–0.1)
Basophils Relative: 0 %
Eosinophils Absolute: 0 10*3/uL (ref 0.0–0.5)
Eosinophils Relative: 0 %
HCT: 33.2 % — ABNORMAL LOW (ref 36.0–46.0)
Hemoglobin: 10.9 g/dL — ABNORMAL LOW (ref 12.0–15.0)
Immature Granulocytes: 0 %
Lymphocytes Relative: 12 %
Lymphs Abs: 1.3 10*3/uL (ref 0.7–4.0)
MCH: 30.4 pg (ref 26.0–34.0)
MCHC: 32.8 g/dL (ref 30.0–36.0)
MCV: 92.7 fL (ref 80.0–100.0)
Monocytes Absolute: 1 10*3/uL (ref 0.1–1.0)
Monocytes Relative: 9 %
Neutro Abs: 8.7 10*3/uL — ABNORMAL HIGH (ref 1.7–7.7)
Neutrophils Relative %: 79 %
Platelets: 228 10*3/uL (ref 150–400)
RBC: 3.58 MIL/uL — ABNORMAL LOW (ref 3.87–5.11)
RDW: 14.2 % (ref 11.5–15.5)
WBC: 11.1 10*3/uL — ABNORMAL HIGH (ref 4.0–10.5)
nRBC: 0 % (ref 0.0–0.2)

## 2020-07-01 MED ORDER — TRAMADOL HCL 50 MG PO TABS: 50.0000 mg | ORAL_TABLET | Freq: Four times a day (QID) | ORAL | 0 refills | Status: AC | PRN

## 2020-07-01 NOTE — Discharge Instructions (Signed)
POST OP INSTRUCTIONS AFTER COLON SURGERY  1. DIET: Be sure to include lots of fluids daily to stay hydrated - 64oz of water per day (8, 8 oz glasses).  Avoid fast food or heavy meals for the first couple of weeks as your are more likely to get nauseated. Avoid raw/uncooked fruits or vegetables for the first 4 weeks (its ok to have these if they are blended into smoothie form). If you have fruits/vegetables, make sure they are cooked until soft enough to mash on the roof of your mouth and chew your food well. Otherwise, diet as tolerated.  2. Take your usually prescribed home medications unless otherwise directed.  3. PAIN CONTROL: a. Pain is best controlled by a usual combination of three different methods TOGETHER: i. Ice/Heat ii. Over the counter pain medication iii. Prescription pain medication b. Most patients will experience some swelling and bruising around the surgical site.  Ice packs or heating pads (30-60 minutes up to 6 times a day) will help. Some people prefer to use ice alone, heat alone, alternating between ice & heat.  Experiment to what works for you.  Swelling and bruising can take several weeks to resolve.   c. It is helpful to take an over-the-counter pain medication regularly for the first few weeks: i. Ibuprofen (Motrin/Advil) - 200mg tabs - take 3 tabs (600mg) every 6 hours as needed for pain (unless you have been directed previously to avoid NSAIDs/ibuprofen) ii. Acetaminophen (Tylenol) - you may take 650mg every 6 hours as needed. You can take this with motrin as they act differently on the body. If you are taking a narcotic pain medication that has acetaminophen in it, do not take over the counter tylenol at the same time. iii. NOTE: You may take both of these medications together - most patients  find it most helpful when alternating between the two (i.e. Ibuprofen at 6am, tylenol at 9am, ibuprofen at 12pm ...) d. A  prescription for pain medication should be given to you  upon discharge.  Take your pain medication as prescribed if your pain is not adequatly controlled with the over-the-counter pain reliefs mentioned above.  4. Avoid getting constipated.  Between the surgery and the pain medications, it is common to experience some constipation.  Increasing fluid intake and taking a fiber supplement (such as Metamucil, Citrucel, FiberCon, MiraLax, etc) 1-2 times a day regularly will usually help prevent this problem from occurring.  A mild laxative (prune juice, Milk of Magnesia, MiraLax, etc) should be taken according to package directions if there are no bowel movements after 48 hours.    5. Dressing: Your incisions are covered in Dermabond which is like sterile superglue for the skin. This will come off on it's own in a couple weeks. It is waterproof and you may bathe normally starting the day after your surgery in a shower. Avoid baths/pools/lakes/oceans until your wounds have fully healed.  6. ACTIVITIES as tolerated:   a. Avoid heavy lifting (>10lbs or 1 gallon of milk) for the next 6 weeks. b. You may resume regular daily activities as tolerated--such as daily self-care, walking, climbing stairs--gradually increasing activities as tolerated.  If you can walk 30 minutes without difficulty, it is safe to try more intense activity such as jogging, treadmill, bicycling, low-impact aerobics.  c. DO NOT PUSH THROUGH PAIN.  Let pain be your guide: If it hurts to do something, don't do it. d. You may drive when you are no longer taking prescription pain medication, you   can comfortably wear a seatbelt, and you can safely maneuver your car and apply brakes.  7. FOLLOW UP in our office a. Please call [facility] at [phone] to set up an appointment to see your surgeon in the office for a follow-up appointment approximately 2 weeks after your surgery. b. Make sure that you call for this appointment the day you arrive home to insure a convenient appointment time.  9. If you  have disability or family leave forms that need to be completed, you may have them completed by your primary care physician's office; for return to work instructions, please ask our office staff and they will be happy to assist you in obtaining this documentation   When to call us [phone]: 1. Poor pain control 2. Reactions / problems with new medications (rash/itching, etc)  3. Fever over 101.5 F (38.5 C) 4. Inability to urinate 5. Nausea/vomiting 6. Worsening swelling or bruising 7. Continued bleeding from incision. 8. Increased pain, redness, or drainage from the incision  The clinic staff is available to answer your questions during regular business hours (8:30am-5pm).  Please don't hesitate to call and ask to speak to one of our nurses for clinical concerns.   A surgeon from [facility] is always on call at the hospitals   If you have a medical emergency, go to the nearest emergency room or call 911.  [facility] [address], [address], [place], [place]  [address] MAIN: [phone] FAX: [phone] [email or web address] 

## 2020-07-01 NOTE — Progress Notes (Signed)
Subjective No acute events. Feeling quite well. In good spirits. Tolerating diet. Having BMs. Denies n/v.  Objective: Vital signs in last 24 hours: Temp:  [97.5 F (36.4 C)-98.4 F (36.9 C)] 98.2 F (36.8 C) (09/12 0500) Pulse Rate:  [58-60] 58 (09/12 0500) Resp:  [16-17] 16 (09/12 0500) BP: (114-141)/(55-70) 141/70 (09/12 0500) SpO2:  [96 %-100 %] 96 % (09/12 0500) Last BM Date: 06/30/20  Intake/Output from previous day: 09/11 0701 - 09/12 0700 In: 2031.3 [P.O.:240; I.V.:1791.3] Out: 2700 [Urine:2700] Intake/Output this shift: No intake/output data recorded.  Gen: NAD, comfortable, in good spirits; wearing mask CV: RRR Pulm: Normal work of breathing Abd: Soft, NT/ND. Incisions c/d/i without erythema nor drainage Ext: SCDs in place  Lab Results: CBC  Recent Labs    06/30/20 0337  WBC 15.9*  HGB 10.4*  HCT 31.4*  PLT 207   BMET Recent Labs    06/30/20 0337  NA 134*  K 3.8  CL 98  CO2 26  GLUCOSE 106*  BUN 7*  CREATININE 0.72  CALCIUM 8.7*   PT/INR No results for input(s): LABPROT, INR in the last 72 hours. ABG No results for input(s): PHART, HCO3 in the last 72 hours.  Invalid input(s): PCO2, PO2  Studies/Results:  Anti-infectives: Anti-infectives (From admission, onward)   Start     Dose/Rate Route Frequency Ordered Stop   06/29/20 0945  cefoTEtan (CEFOTAN) 2 g in sodium chloride 0.9 % 100 mL IVPB        2 g 200 mL/hr over 30 Minutes Intravenous On call to O.R. 06/29/20 0941 06/29/20 1055   06/29/20 0945  neomycin (MYCIFRADIN) tablet 1,000 mg  Status:  Discontinued        1,000 mg Oral Every 8 hours 06/29/20 0941 06/29/20 0943   06/29/20 0943  sodium chloride 0.9 % with cefoTEtan (CEFOTAN) ADS Med       Note to Pharmacy: Charmayne Sheer   : cabinet override      06/29/20 0943 06/29/20 1037       Assessment/Plan: Patient Active Problem List   Diagnosis Date Noted  . S/P laparoscopic-assisted sigmoidectomy 06/29/2020  . Colonic  diverticular abscess 03/05/2020   s/p Procedure(s): ROBOTIC LOW ANTERIOR RESECTION, LYSIS OF ADHESIONS, TAP BLOCK FLEXIBLE SIGMOIDOSCOPY CYSTOSCOPY WITH FIREFLY STENT PLACEMENT 06/29/2020  -She is doing quite well -Soft diet as tolerated -D/C Entereg -Not quite ready to go home from pain control standpoint; would like to see she continues to tolerate diet and does well today - possible discharge home tomorrow if progressing appropriately -PPx: SQH, SCDs   LOS: 2 days   Sharon Mt. Dema Severin, M.D. [facility] Use [email or web address] to contact on call provider

## 2020-07-01 NOTE — Plan of Care (Signed)
  Problem: Education: Goal: Knowledge of General Education information will improve Description: Including pain rating scale, medication(s)/side effects and non-pharmacologic comfort measures Outcome: Progressing   Problem: Clinical Measurements: Goal: Will remain free from infection Outcome: Progressing   Problem: Clinical Measurements: Goal: Diagnostic test results will improve Outcome: Progressing   

## 2020-07-01 NOTE — Progress Notes (Signed)
Physical Therapy Treatment Patient Details Name: Olivia Gomez MRN: [id number] DOB: 1945/12/23 Today's Date: 07/01/2020    History of Present Illness Pt admitted with abd pain 2* diverticulitis and now s/p low anterior resection, lysis of adhesions. and ureteral stent placement.      PT Comments    Pt up to ambulate in hall with noted improved stability.  But, pt with increased difficulty with supine<>sit 2* increased abdominal pain.  Pt reviewed log roll technique multiple times progressing to supervision level with min VC.   Follow Up Recommendations  No PT follow up     Equipment Recommendations  None recommended by PT    Recommendations for Other Services       Precautions / Restrictions Precautions Precautions: Fall Restrictions Weight Bearing Restrictions: No    Mobility  Bed Mobility Overal bed mobility: Needs Assistance Bed Mobility: Rolling;Sidelying to Sit;Sit to Sidelying Rolling: Min assist;Min guard;Supervision Sidelying to sit: Min assist;Min guard;Supervision     Sit to sidelying: Min assist;Min guard;Supervision General bed mobility comments: reviewed log roll technique in/out bed multiple times  Transfers Overall transfer level: Needs assistance Equipment used: None Transfers: Sit to/from Stand Sit to Stand: Supervision         General transfer comment: for safety  Ambulation/Gait Ambulation/Gait assistance: Min guard;Supervision Gait Distance (Feet): 450 Feet Assistive device: None Gait Pattern/deviations: Step-through pattern;Shuffle;Decreased step length - right;Decreased step length - left     General Gait Details: mild initial instability noted but improved with increased distance and with no LOB   Stairs             Wheelchair Mobility    Modified Rankin (Stroke Patients Only)       Balance Overall balance assessment: Mild deficits observed, not formally tested                                           Cognition Arousal/Alertness: Awake/alert Behavior During Therapy: WFL for tasks assessed/performed Overall Cognitive Status: Within Functional Limits for tasks assessed                                        Exercises      General Comments        Pertinent Vitals/Pain Pain Assessment: Faces Faces Pain Scale: Hurts little more Pain Location: abdominal pain with supine<>sit Pain Descriptors / Indicators: Sore;Aching Pain Intervention(s): Monitored during session;Limited activity within patient's tolerance;Premedicated before session    Home Living                      Prior Function            PT Goals (current goals can now be found in the care plan section) Acute Rehab PT Goals Patient Stated Goal: Regain IND PT Goal Formulation: With patient Time For Goal Achievement: 07/14/20 Potential to Achieve Goals: Good Progress towards PT goals: Progressing toward goals    Frequency    Min 3X/week      PT Plan Current plan remains appropriate    Co-evaluation              AM-PAC PT "6 Clicks" Mobility   Outcome Measure  Help needed turning from your back to your side while in a flat bed without using bedrails?: A  Little Help needed moving from lying on your back to sitting on the side of a flat bed without using bedrails?: A Little Help needed moving to and from a bed to a chair (including a wheelchair)?: A Little Help needed standing up from a chair using your arms (e.g., wheelchair or bedside chair)?: A Little Help needed to walk in hospital room?: A Little Help needed climbing 3-5 steps with a railing? : A Little 6 Click Score: 18    End of Session Equipment Utilized During Treatment: Gait belt Activity Tolerance: Patient tolerated treatment well Patient left: in chair;with call bell/phone within reach Nurse Communication: Mobility status PT Visit Diagnosis: Difficulty in walking, not elsewhere classified (R26.2)      Time: 2992-4268 PT Time Calculation (min) (ACUTE ONLY): 23 min  Charges:  $Gait Training: 8-22 mins $Therapeutic Activity: 8-22 mins                     [place] Pager [phone] Office [phone]    Amaiya Scruton 07/01/2020, 11:54 AM

## 2020-07-02 LAB — SURGICAL PATHOLOGY

## 2020-07-02 NOTE — Discharge Summary (Signed)
Patient ID: Olivia Gomez MRN: [id number] DOB/AGE: Apr 13, 1946 74 y.o.  Admit date: 06/29/2020 Discharge date: 07/02/2020  Discharge Diagnoses Patient Active Problem List   Diagnosis Date Noted  . S/P laparoscopic-assisted sigmoidectomy 06/29/2020  . Colonic diverticular abscess 03/05/2020    Procedures OR 06/29/20: PROCEDURE:  1. Robotic low anterior resection with double stapled coloproctostomy 2. Lysis of adhesions x 45 minutes 3. Flexible sigmoidoscopy  Hospital Course: Admitted postoperatively where she recovered well. She began having flatus and BMs. Her diet was advanced which she tolerated well. She was up walking around on her own without difficulty. On 07/02/20 she is noted to be doing quite well and comfortable with discharge home today    Allergies as of 07/02/2020      Reactions   Flagyl [metronidazole] Hives      Medication List    STOP taking these medications   neomycin 500 MG tablet Commonly known as: MYCIFRADIN     TAKE these medications   aspirin EC 81 MG tablet Take 81 mg by mouth daily in the afternoon.   COD LIVER OIL PO Take 415 mg by mouth daily in the afternoon.   CoQ10 100 MG Caps Take 100 mg by mouth once a week.   D3 50 MCG (2000 UT) Tabs Generic drug: Cholecalciferol Take 2,000 Units by mouth daily.   Fish Oil 1200 MG Caps Take 1,200 mg by mouth daily.   Florastor 250 MG capsule Generic drug: saccharomyces boulardii Take 250 mg by mouth 2 (two) times daily.   MAGNESIUM CITRATE PO Take 150 mg by mouth once a week.   SUPER B COMPLEX/C PO Take 1 capsule by mouth once a week.   traMADol 50 MG tablet Commonly known as: Ultram Take 1 tablet (50 mg total) by mouth every 6 (six) hours as needed for up to 5 days (postop pain not controlled with tylenol and ibuprofen first).   Turmeric 500 MG Caps Take 500 mg by mouth once a week.   vitamin C 1000 MG tablet Take 1,000 mg by mouth once a week.   Zinc 50 MG Tabs Take 50 mg by mouth  daily in the afternoon.         Follow-up Information    Olivia Roup, MD Follow up in 2 week(s).   Specialty: General Surgery Contact information: [place] [address] [phone]               Olivia Gomez M. Dema Severin, M.D. [facility]

## 2020-07-02 NOTE — Care Management Important Message (Signed)
Important Message  Patient Details IM Letter given to the Patient Name: Olivia Gomez MRN: [id number] Date of Birth: 18-Mar-1946   Medicare Important Message Given:  Yes     Kerin Salen 07/02/2020, 12:20 PM

## 2020-07-02 NOTE — Progress Notes (Signed)
Subjective No acute events. Feeling well. Tolerating soft diet without n/v.   Objective: Vital signs in last 24 hours: Temp:  [98.3 F (36.8 C)-98.5 F (36.9 C)] 98.5 F (36.9 C) (09/13 0443) Pulse Rate:  [60-64] 60 (09/13 0443) Resp:  [14-16] 16 (09/13 0443) BP: (129-148)/(60-71) 129/71 (09/13 0443) SpO2:  [97 %-99 %] 97 % (09/13 0443) Weight:  [45.9 kg] 45.9 kg (09/13 0500) Last BM Date: 06/30/20  Intake/Output from previous day: 09/12 0701 - 09/13 0700 In: 1311 [P.O.:540; I.V.:771] Out: 5700 [Urine:5700] Intake/Output this shift: No intake/output data recorded.  Gen: NAD, comfortable, in good spirits; wearing mask CV: RRR Pulm: Normal work of breathing Abd: Soft, NT/ND. Incisions c/d/i without erythema nor drainage Ext: SCDs in place  Lab Results: CBC  Recent Labs    06/30/20 0337 07/01/20 1041  WBC 15.9* 11.1*  HGB 10.4* 10.9*  HCT 31.4* 33.2*  PLT 207 228   BMET Recent Labs    06/30/20 0337  NA 134*  K 3.8  CL 98  CO2 26  GLUCOSE 106*  BUN 7*  CREATININE 0.72  CALCIUM 8.7*   PT/INR No results for input(s): LABPROT, INR in the last 72 hours. ABG No results for input(s): PHART, HCO3 in the last 72 hours.  Invalid input(s): PCO2, PO2  Studies/Results:  Anti-infectives: Anti-infectives (From admission, onward)   Start     Dose/Rate Route Frequency Ordered Stop   06/29/20 0945  cefoTEtan (CEFOTAN) 2 g in sodium chloride 0.9 % 100 mL IVPB        2 g 200 mL/hr over 30 Minutes Intravenous On call to O.R. 06/29/20 0941 06/29/20 1055   06/29/20 0945  neomycin (MYCIFRADIN) tablet 1,000 mg  Status:  Discontinued        1,000 mg Oral Every 8 hours 06/29/20 0941 06/29/20 0943   06/29/20 0943  sodium chloride 0.9 % with cefoTEtan (CEFOTAN) ADS Med       Note to Pharmacy: Charmayne Sheer   : cabinet override      06/29/20 0943 06/29/20 1037       Assessment/Plan: Patient Active Problem List   Diagnosis Date Noted  . S/P laparoscopic-assisted  sigmoidectomy 06/29/2020  . Colonic diverticular abscess 03/05/2020   s/p Procedure(s): ROBOTIC LOW ANTERIOR RESECTION, LYSIS OF ADHESIONS, TAP BLOCK FLEXIBLE SIGMOIDOSCOPY CYSTOSCOPY WITH FIREFLY STENT PLACEMENT 06/29/2020  -She is doing quite well -Soft diet as tolerated -Pain well controlled, ambulating, tolerating diet, having bowel function. She is comfortable with discharge home today and this seems reasonable to me as well -PPx: SQH, SCDs   LOS: 3 days   Sharon Mt. Dema Severin, M.D. [facility] Use [email or web address] to contact on call provider

## 2020-07-02 NOTE — Progress Notes (Signed)
D/C instructions given to patient. Patient had no questions. NT or writer will wheel patient out once her rides get here

## 2020-07-06 ENCOUNTER — Encounter (HOSPITAL_COMMUNITY): Payer: Self-pay | Admitting: Surgery

## 2020-08-08 DIAGNOSIS — R69 Illness, unspecified: Secondary | ICD-10-CM | POA: Diagnosis not present

## 2020-08-09 DIAGNOSIS — H524 Presbyopia: Secondary | ICD-10-CM | POA: Diagnosis not present

## 2020-08-22 DIAGNOSIS — R69 Illness, unspecified: Secondary | ICD-10-CM | POA: Diagnosis not present

## 2020-08-23 DIAGNOSIS — E559 Vitamin D deficiency, unspecified: Secondary | ICD-10-CM | POA: Diagnosis not present

## 2020-08-23 DIAGNOSIS — K5720 Diverticulitis of large intestine with perforation and abscess without bleeding: Secondary | ICD-10-CM | POA: Diagnosis not present

## 2020-08-23 DIAGNOSIS — I700 Atherosclerosis of aorta: Secondary | ICD-10-CM | POA: Diagnosis not present

## 2020-08-23 DIAGNOSIS — J439 Emphysema, unspecified: Secondary | ICD-10-CM | POA: Diagnosis not present

## 2020-08-23 DIAGNOSIS — Z0000 Encounter for general adult medical examination without abnormal findings: Secondary | ICD-10-CM | POA: Diagnosis not present

## 2020-08-23 DIAGNOSIS — E7800 Pure hypercholesterolemia, unspecified: Secondary | ICD-10-CM | POA: Diagnosis not present

## 2020-08-23 DIAGNOSIS — Z720 Tobacco use: Secondary | ICD-10-CM | POA: Diagnosis not present

## 2020-09-03 ENCOUNTER — Other Ambulatory Visit: Payer: Self-pay | Admitting: Family Medicine

## 2020-09-03 DIAGNOSIS — E559 Vitamin D deficiency, unspecified: Secondary | ICD-10-CM

## 2020-09-03 DIAGNOSIS — Z720 Tobacco use: Secondary | ICD-10-CM

## 2020-09-03 DIAGNOSIS — E2839 Other primary ovarian failure: Secondary | ICD-10-CM

## 2020-09-16 DIAGNOSIS — Z0100 Encounter for examination of eyes and vision without abnormal findings: Secondary | ICD-10-CM | POA: Diagnosis not present

## 2020-12-17 ENCOUNTER — Ambulatory Visit
Admission: RE | Admit: 2020-12-17 | Discharge: 2020-12-17 | Disposition: A | Discharge status: Home / Self Care | Payer: Medicare HMO | Source: Ambulatory Visit | Attending: Family Medicine | Admitting: Family Medicine

## 2020-12-17 ENCOUNTER — Other Ambulatory Visit: Payer: Self-pay

## 2020-12-17 DIAGNOSIS — E2839 Other primary ovarian failure: Secondary | ICD-10-CM

## 2020-12-17 DIAGNOSIS — Z720 Tobacco use: Secondary | ICD-10-CM

## 2020-12-17 DIAGNOSIS — M8589 Other specified disorders of bone density and structure, multiple sites: Secondary | ICD-10-CM | POA: Diagnosis not present

## 2020-12-17 DIAGNOSIS — E559 Vitamin D deficiency, unspecified: Secondary | ICD-10-CM

## 2020-12-17 DIAGNOSIS — Z780 Asymptomatic menopausal state: Secondary | ICD-10-CM | POA: Diagnosis not present

## 2021-01-30 DIAGNOSIS — Z9049 Acquired absence of other specified parts of digestive tract: Secondary | ICD-10-CM | POA: Diagnosis not present

## 2021-01-30 DIAGNOSIS — Z8719 Personal history of other diseases of the digestive system: Secondary | ICD-10-CM | POA: Diagnosis not present

## 2021-01-30 DIAGNOSIS — Z86010 Personal history of colonic polyps: Secondary | ICD-10-CM | POA: Diagnosis not present

## 2021-02-12 IMAGING — CT CT ABD-PELV W/ CM
2 of 5 series · 15 of 46 positions shown, 17 images · IV contrast (APPLIED)
Comparison: 03/27/2020

CLINICAL DATA: Diverticulitis.

EXAM:
CT ABDOMEN AND PELVIS WITH CONTRAST
TECHNIQUE: Multidetector CT imaging of the abdomen and pelvis was performed
using the standard protocol following bolus administration of
intravenous contrast.
CONTRAST:  100mL OMNIPAQUE IOHEXOL 300 MG/ML  SOLN

[Series 2: axial st · axial · 0.67mm/px · z∈[-570,-240]mm · 12 of 80 slices shown, 14 images]
[im 7/80  soft-tissue]
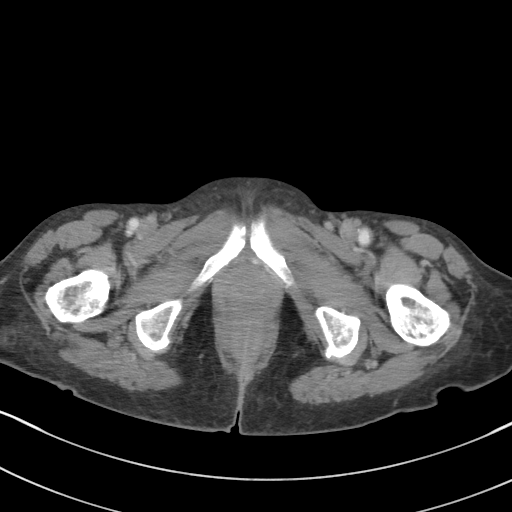
[im 7/80  bone]
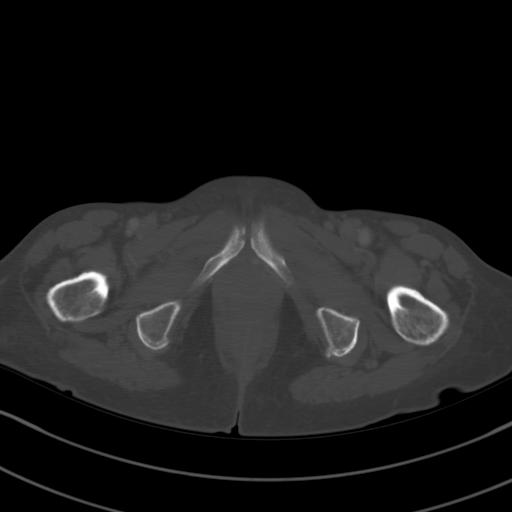
[im 13/80  soft-tissue]
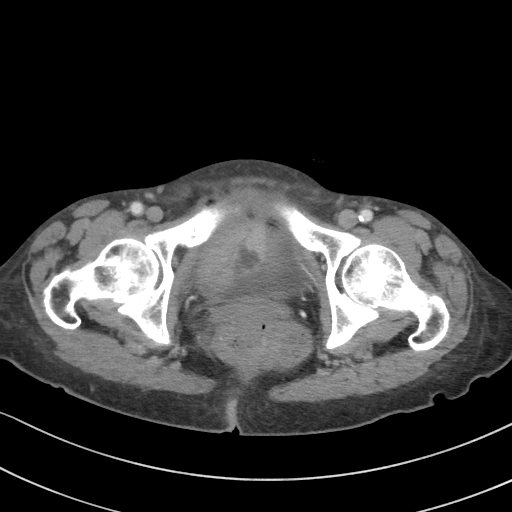
[im 19/80  soft-tissue]
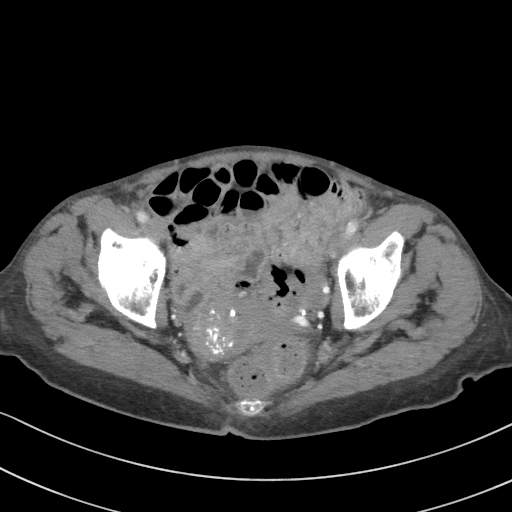
[im 25/80  soft-tissue]
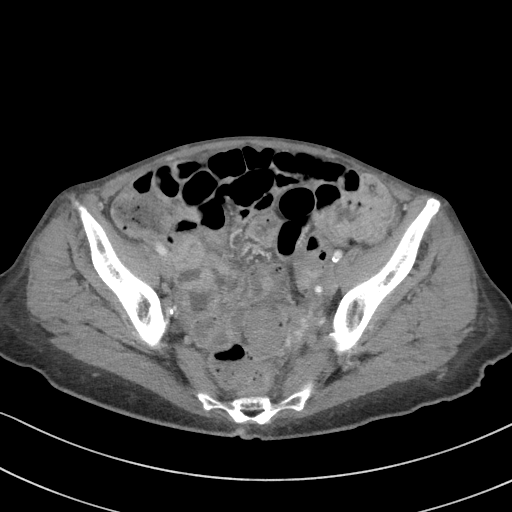
[im 31/80  soft-tissue]
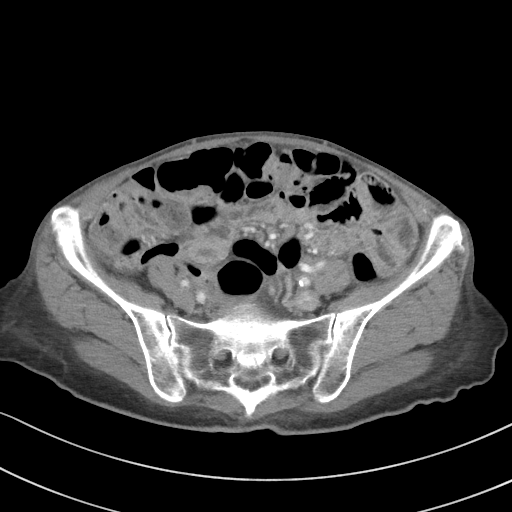
[im 37/80  soft-tissue]
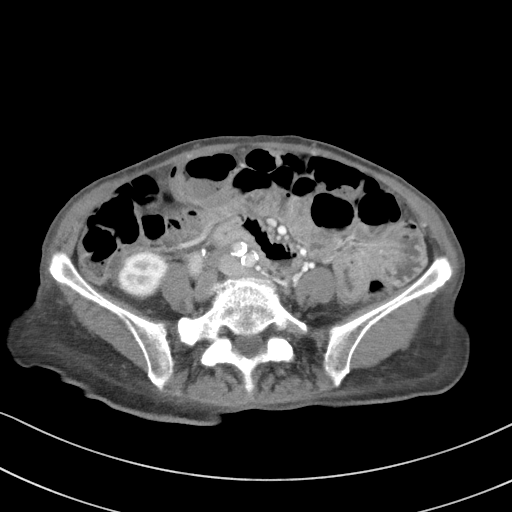
[im 43/80  soft-tissue]
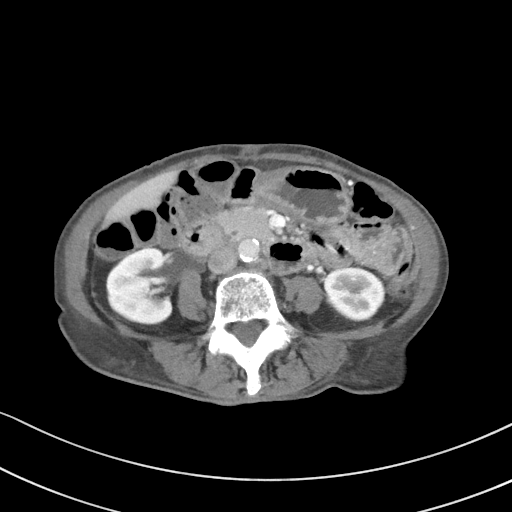
[im 49/80  soft-tissue]
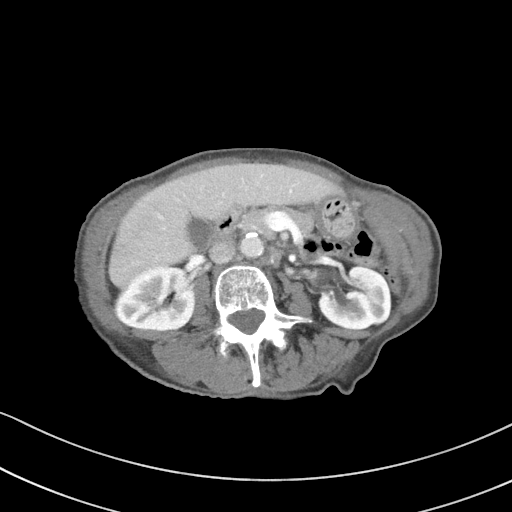
[im 55/80  soft-tissue]
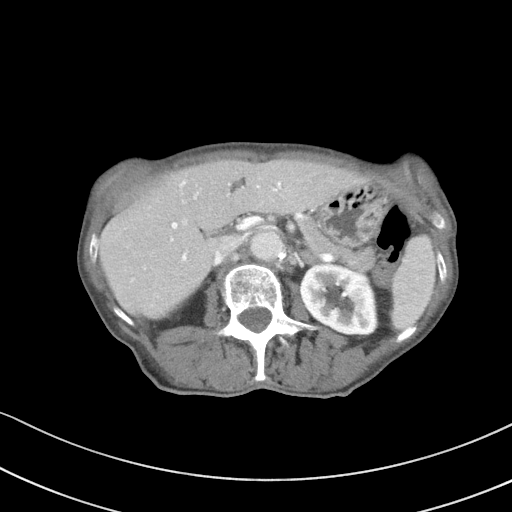
[im 55/80  bone]
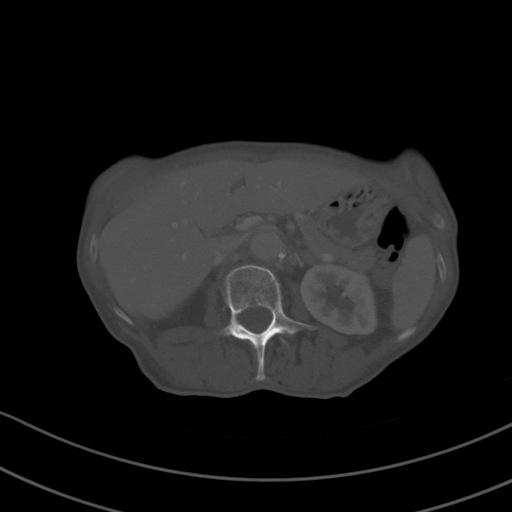
[im 61/80  soft-tissue]
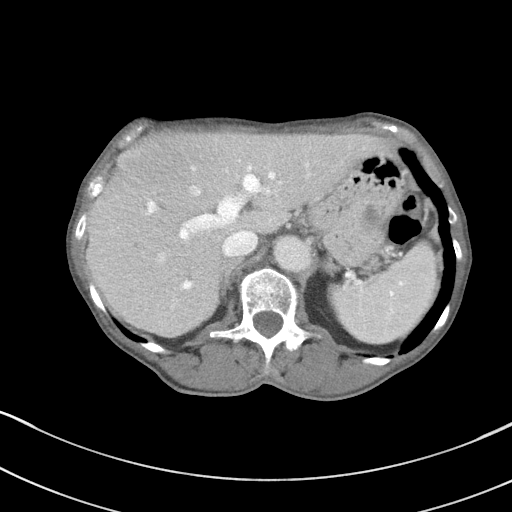
[im 67/80  soft-tissue]
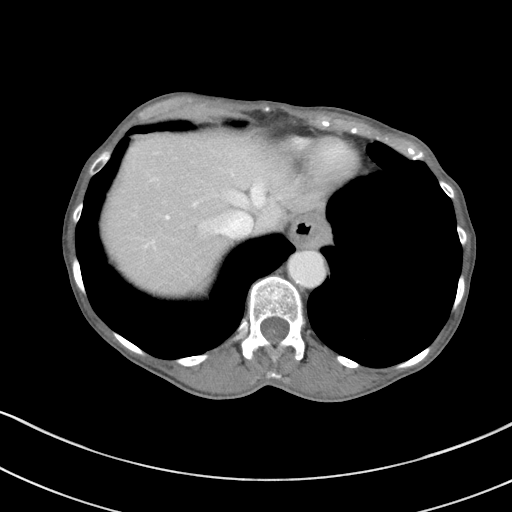
[im 73/80  soft-tissue]
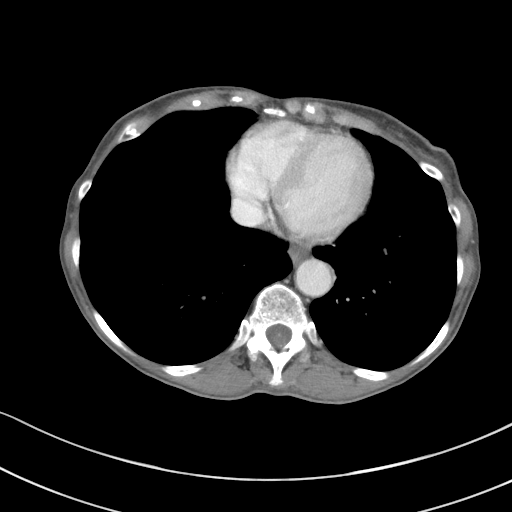

[Series 4: coronal st · coronal · 0.68mm/px · 3 of 81 slices shown]
[im 27/81  soft-tissue]
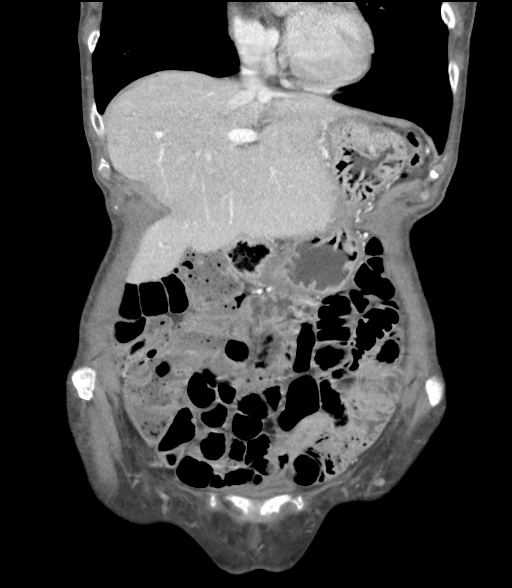
[im 36/81  soft-tissue]
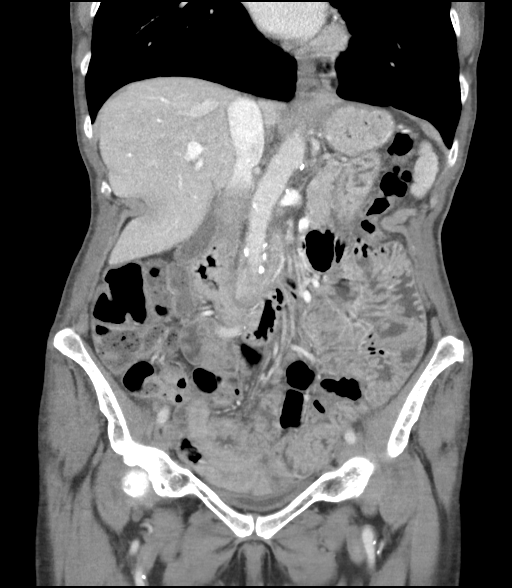
[im 45/81  soft-tissue]
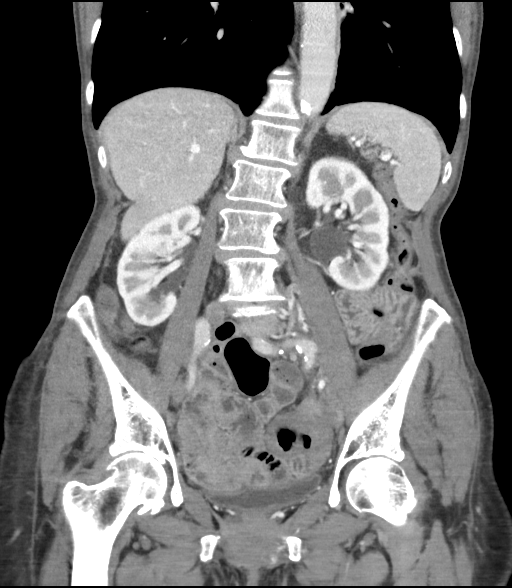

[15 of 46 positions shown; findings below may reference images not displayed]

FINDINGS: Lower chest: No acute abnormality.

Hepatobiliary: No focal liver abnormality is seen. No gallstones,
gallbladder wall thickening, or biliary dilatation.

Pancreas: Unremarkable. No pancreatic ductal dilatation or
surrounding inflammatory changes.

Spleen: Normal in size without focal abnormality.

Adrenals/Urinary Tract: Normal right adrenal gland. Unchanged
hypertrophy of the left adrenal gland. Interval improvement in
bilateral hydronephrosis with residual pelvocaliectasis. Urinary
bladder is unremarkable.

Stomach/Bowel: Stomach is normal. No pathologic dilatation of the
small or large bowel loops. Evaluation of the pelvic bowel loops is
diminished due to lack of enteric contrast material. Mild wall
thickening involving the sigmoid colon is again noted but appears
subjectively improved from previous exam. The previously described
extraluminal air-fluid collection appears decreased in the interval
measuring 3.5 x 1.7 cm, image 58/2. Previously 4.8 x 3.0 cm.

Vascular/Lymphatic: Aortic atherosclerosis. No aneurysm. No
abdominopelvic adenopathy.

Reproductive: Calcified uterine fibroids are again noted. No adnexal
mass identified.

Other: No significant free fluid identified.

Musculoskeletal: Mild thoracolumbar scoliosis and degenerative disc
disease.
IMPRESSION: 1. Changes of chronic diverticulitis are again noted which appears
subjectively improved in the interval. The previously referenced
extraluminal collection of gas and fluid appears decreased in size
from 03/27/2020. However, evaluation of this area is limited due to
lack of IV contrast material particularly within the area of
concern. No new or progressive changes identified at this time.
2. Bilateral pelvocaliectasis is improved from previous
hydronephrosis likely reflecting resolving inflammatory changes
within the pelvis.
3. Aortic atherosclerosis.

Aortic Atherosclerosis ([id number]).

## 2021-04-23 DIAGNOSIS — Z980 Intestinal bypass and anastomosis status: Secondary | ICD-10-CM | POA: Diagnosis not present

## 2021-04-23 DIAGNOSIS — K5730 Diverticulosis of large intestine without perforation or abscess without bleeding: Secondary | ICD-10-CM | POA: Diagnosis not present

## 2021-04-23 DIAGNOSIS — K635 Polyp of colon: Secondary | ICD-10-CM | POA: Diagnosis not present

## 2021-04-23 DIAGNOSIS — Z86010 Personal history of colonic polyps: Secondary | ICD-10-CM | POA: Diagnosis not present

## 2021-04-23 DIAGNOSIS — K644 Residual hemorrhoidal skin tags: Secondary | ICD-10-CM | POA: Diagnosis not present

## 2021-04-23 DIAGNOSIS — D125 Benign neoplasm of sigmoid colon: Secondary | ICD-10-CM | POA: Diagnosis not present

## 2021-04-26 DIAGNOSIS — K635 Polyp of colon: Secondary | ICD-10-CM | POA: Diagnosis not present

## 2021-07-24 DIAGNOSIS — Z23 Encounter for immunization: Secondary | ICD-10-CM | POA: Diagnosis not present

## 2021-07-26 ENCOUNTER — Other Ambulatory Visit: Payer: Self-pay

## 2021-07-26 ENCOUNTER — Ambulatory Visit (HOSPITAL_COMMUNITY)
Admission: EM | Admit: 2021-07-26 | Discharge: 2021-07-26 | Discharge status: Against Medical Advice | Payer: Medicare HMO

## 2021-07-26 ENCOUNTER — Ambulatory Visit
Admission: EM | Admit: 2021-07-26 | Discharge: 2021-07-26 | Disposition: A | Discharge status: Other Acute Inpatient Hospital | Payer: Medicare HMO

## 2021-07-27 DIAGNOSIS — H6122 Impacted cerumen, left ear: Secondary | ICD-10-CM | POA: Diagnosis not present

## 2021-08-20 DIAGNOSIS — H93293 Other abnormal auditory perceptions, bilateral: Secondary | ICD-10-CM | POA: Diagnosis not present

## 2021-08-20 DIAGNOSIS — H6121 Impacted cerumen, right ear: Secondary | ICD-10-CM | POA: Diagnosis not present

## 2021-08-20 DIAGNOSIS — R69 Illness, unspecified: Secondary | ICD-10-CM | POA: Diagnosis not present

## 2021-09-02 DIAGNOSIS — K5730 Diverticulosis of large intestine without perforation or abscess without bleeding: Secondary | ICD-10-CM | POA: Diagnosis not present

## 2021-09-02 DIAGNOSIS — J439 Emphysema, unspecified: Secondary | ICD-10-CM | POA: Diagnosis not present

## 2021-09-02 DIAGNOSIS — E559 Vitamin D deficiency, unspecified: Secondary | ICD-10-CM | POA: Diagnosis not present

## 2021-09-02 DIAGNOSIS — I700 Atherosclerosis of aorta: Secondary | ICD-10-CM | POA: Diagnosis not present

## 2021-09-02 DIAGNOSIS — R918 Other nonspecific abnormal finding of lung field: Secondary | ICD-10-CM | POA: Diagnosis not present

## 2021-09-02 DIAGNOSIS — E7800 Pure hypercholesterolemia, unspecified: Secondary | ICD-10-CM | POA: Diagnosis not present

## 2021-09-02 DIAGNOSIS — Z0000 Encounter for general adult medical examination without abnormal findings: Secondary | ICD-10-CM | POA: Diagnosis not present

## 2021-09-02 DIAGNOSIS — Z720 Tobacco use: Secondary | ICD-10-CM | POA: Diagnosis not present

## 2021-09-02 DIAGNOSIS — E2839 Other primary ovarian failure: Secondary | ICD-10-CM | POA: Diagnosis not present

## 2021-09-02 DIAGNOSIS — M4185 Other forms of scoliosis, thoracolumbar region: Secondary | ICD-10-CM | POA: Diagnosis not present

## 2021-09-03 ENCOUNTER — Other Ambulatory Visit: Payer: Self-pay | Admitting: Family Medicine

## 2021-09-03 DIAGNOSIS — R918 Other nonspecific abnormal finding of lung field: Secondary | ICD-10-CM

## 2021-09-04 DIAGNOSIS — M25569 Pain in unspecified knee: Secondary | ICD-10-CM | POA: Diagnosis not present

## 2021-09-04 DIAGNOSIS — M4185 Other forms of scoliosis, thoracolumbar region: Secondary | ICD-10-CM | POA: Diagnosis not present

## 2021-09-06 ENCOUNTER — Ambulatory Visit (HOSPITAL_COMMUNITY)
Admission: RE | Admit: 2021-09-06 | Discharge: 2021-09-06 | Disposition: A | Discharge status: Home / Self Care | Payer: Medicare HMO | Source: Ambulatory Visit | Attending: Family Medicine | Admitting: Family Medicine

## 2021-09-06 ENCOUNTER — Other Ambulatory Visit: Payer: Self-pay

## 2021-09-06 ENCOUNTER — Other Ambulatory Visit (HOSPITAL_COMMUNITY): Payer: Self-pay | Admitting: Family Medicine

## 2021-09-06 DIAGNOSIS — R0989 Other specified symptoms and signs involving the circulatory and respiratory systems: Secondary | ICD-10-CM

## 2021-09-10 DIAGNOSIS — M25569 Pain in unspecified knee: Secondary | ICD-10-CM | POA: Diagnosis not present

## 2021-09-10 DIAGNOSIS — M4185 Other forms of scoliosis, thoracolumbar region: Secondary | ICD-10-CM | POA: Diagnosis not present

## 2021-09-25 ENCOUNTER — Encounter: Payer: Self-pay | Admitting: *Deleted

## 2021-10-01 ENCOUNTER — Ambulatory Visit
Admission: RE | Admit: 2021-10-01 | Discharge: 2021-10-01 | Disposition: A | Discharge status: Home / Self Care | Payer: Medicare HMO | Source: Ambulatory Visit | Attending: Family Medicine | Admitting: Family Medicine

## 2021-10-01 DIAGNOSIS — R918 Other nonspecific abnormal finding of lung field: Secondary | ICD-10-CM

## 2021-10-01 DIAGNOSIS — R69 Illness, unspecified: Secondary | ICD-10-CM | POA: Diagnosis not present

## 2021-10-01 DIAGNOSIS — F17210 Nicotine dependence, cigarettes, uncomplicated: Secondary | ICD-10-CM | POA: Diagnosis not present

## 2021-10-02 DIAGNOSIS — H903 Sensorineural hearing loss, bilateral: Secondary | ICD-10-CM | POA: Diagnosis not present

## 2021-10-09 ENCOUNTER — Encounter: Payer: Self-pay | Admitting: Vascular Surgery

## 2021-10-09 ENCOUNTER — Ambulatory Visit: Payer: Medicare HMO | Admitting: Vascular Surgery

## 2021-10-09 ENCOUNTER — Other Ambulatory Visit: Payer: Self-pay

## 2021-10-09 VITALS — BP 135/80 | HR 65 | Temp 97.9°F | Resp 20 | Ht 62.0 in | Wt 104.0 lb

## 2021-10-09 DIAGNOSIS — M25561 Pain in right knee: Secondary | ICD-10-CM | POA: Diagnosis not present

## 2021-10-09 DIAGNOSIS — M25562 Pain in left knee: Secondary | ICD-10-CM | POA: Diagnosis not present

## 2021-10-09 NOTE — Progress Notes (Signed)
Patient ID: Olivia Gomez, female   DOB: 07-Feb-1946, 75 y.o.   MRN: [id number]  Reason for Consult: New Patient (Initial Visit)   Referred by Marda Stalker, PA-C  Subjective:     HPI:  Olivia Gomez is a 75 y.o. female without previous vascular disease.  She is a current every day smoker with diagnosis of emphysema.  She does have known aortic atherosclerosis.  She had some abnormal feeling in her right foot which initially led to ultrasound evaluation of her arteries which was reportedly abnormal.  She denies any claudication.  No previous history of coronary artery disease.  No previous history of stroke, TIA or amaurosis.  No personal or family history of aneurysm disease.  Her father apparently did have coronary artery disease. She does not take blood thinners.  Her only complaint today is continued weight loss but she denies any food fear or pain with eating.  Past Medical History:  Diagnosis Date   Aortic atherosclerosis (HCC)    Cancer (HCC)    Skin -nose   Elevated cholesterol    Emphysema lung (HCC)    Lump in chest    end or stunum   Tobacco user    Vitamin D deficiency    Family History  Problem Relation Age of Onset   Dementia Mother    Heart failure Father    Canavan disease Maternal Grandmother        BREAST   Cancer Paternal Grandmother        INTESTINAL   Past Surgical History:  Procedure Laterality Date   APPENDECTOMY     COLONOSCOPY  2010   CYSTOSCOPY WITH STENT PLACEMENT N/A 06/29/2020   Procedure: CYSTOSCOPY WITH FIREFLY WITH OPEN ENDED CATHETER PLACEMENT;  Surgeon: Festus Aloe, MD;  Location: [facility];  Service: Urology;  Laterality: N/A;   FLEXIBLE SIGMOIDOSCOPY N/A 06/29/2020   Procedure: FLEXIBLE SIGMOIDOSCOPY;  Surgeon: Ileana Roup, MD;  Location: [facility];  Service: General;  Laterality: N/A;    Short Social History:  Social History   Tobacco Use   Smoking status: Every Day    Packs/day: 1.00    Years: 52.00    Pack years:  52.00    Types: Cigarettes   Smokeless tobacco: Never  Substance Use Topics   Alcohol use: Not Currently    Alcohol/week: 7.0 standard drinks    Types: 7 Standard drinks or equivalent per week    Allergies  Allergen Reactions   Flagyl [Metronidazole] Hives    Current Outpatient Medications  Medication Sig Dispense Refill   Ascorbic Acid (VITAMIN C) 1000 MG tablet Take 1,000 mg by mouth once a week.     B Complex Vitamins (VITAMIN B COMPLEX 100 IJ)      Biotin 300 MCG TABS 1 tablet     Cholecalciferol (D3) 50 MCG (2000 UT) TABS Take 2,000 Units by mouth daily.     Cod Liver Oil CAPS See admin instructions.     Coenzyme Q10 (CO Q 10) 100 MG CAPS 1 capsule with a meal     Magnesium 250 MG TABS 1 tablet with a meal     Omega-3 Fatty Acids (FISH OIL) 1200 MG CAPS Take 1,200 mg by mouth daily.     polyethylene glycol (MIRALAX / GLYCOLAX) 17 g packet 1 packet mixed with 8 ounces of fluid     SUPER B COMPLEX/C PO Take 1 capsule by mouth once a week.     Turmeric 500 MG CAPS Take  500 mg by mouth once a week.     Zinc 50 MG TABS Take 50 mg by mouth daily in the afternoon.     No current facility-administered medications for this visit.    Review of Systems  Constitutional: Positive for unexpected weight change.  HENT: HENT negative.  Eyes: Eyes negative.  Respiratory: Respiratory negative.  Cardiovascular: Cardiovascular negative.  GI: Gastrointestinal negative.  Musculoskeletal: Musculoskeletal negative.  Skin: Skin negative.  Neurological: Neurological negative. Hematologic: Hematologic/lymphatic negative.  Psychiatric: Psychiatric negative.       Objective:  Objective   Vitals:   10/09/21 1056  BP: 135/80  Pulse: 65  Resp: 20  Temp: 97.9 F (36.6 C)  SpO2: 98%    Physical Exam HENT:     Head: Normocephalic.     Nose:     Comments: Wearing a mask Eyes:     Pupils: Pupils are equal, round, and reactive to light.  Neck:     Vascular: No carotid bruit.   Cardiovascular:     Rate and Rhythm: Regular rhythm.     Pulses:          Radial pulses are 2+ on the right side and 2+ on the left side.       Popliteal pulses are 2+ on the right side and 2+ on the left side.       Dorsalis pedis pulses are 2+ on the right side and 2+ on the left side.       Posterior tibial pulses are 0 on the right side and 0 on the left side.     Heart sounds: Normal heart sounds.  Pulmonary:     Effort: Pulmonary effort is normal.     Breath sounds: Normal breath sounds.  Abdominal:     General: Abdomen is flat.     Palpations: Abdomen is soft. There is no mass.  Musculoskeletal:     Cervical back: Normal range of motion and neck supple.  Neurological:     Mental Status: She is alert.    Data: ABI Findings:  +---------+------------------+-----+---------+--------+   Right     Rt Pressure (mmHg) Index Waveform  Comment    +---------+------------------+-----+---------+--------+   Brachial  166                                           +---------+------------------+-----+---------+--------+   PTA       140                0.84  biphasic             +---------+------------------+-----+---------+--------+   DP        164                0.99  triphasic            +---------+------------------+-----+---------+--------+   Great Toe 83                 0.50                       +---------+------------------+-----+---------+--------+   +---------+------------------+-----+---------+-------+   Left      Lt Pressure (mmHg) Index Waveform  Comment   +---------+------------------+-----+---------+-------+   Brachial  160                                          +---------+------------------+-----+---------+-------+  PTA       142                0.86  biphasic            +---------+------------------+-----+---------+-------+   DP        161                0.97  triphasic           +---------+------------------+-----+---------+-------+   Great Toe 90                  0.54                      +---------+------------------+-----+---------+-------+   +-------+-----------+-----------+------------+------------+   ABI/TBI Today's ABI Today's TBI Previous ABI Previous TBI   +-------+-----------+-----------+------------+------------+   Right   0.99        0.5                                     +-------+-----------+-----------+------------+------------+   Left    0.97        0.54                                    +-------+-----------+-----------+------------+------------+          Assessment/Plan:     75 year old female with pain in the right foot does not appear to be arterial in nature with bounding dorsalis pedis pulses bilaterally.  She has no history of coronary artery disease or carotid artery disease without any evidence of bruits.  She can see me on an as-needed basis.     Waynetta Sandy MD [facility]

## 2021-10-27 ENCOUNTER — Encounter: Payer: Self-pay | Admitting: Vascular Surgery

## 2022-02-24 ENCOUNTER — Other Ambulatory Visit: Payer: Self-pay | Admitting: *Deleted

## 2022-02-24 DIAGNOSIS — Z122 Encounter for screening for malignant neoplasm of respiratory organs: Secondary | ICD-10-CM

## 2022-02-24 DIAGNOSIS — F17210 Nicotine dependence, cigarettes, uncomplicated: Secondary | ICD-10-CM

## 2022-02-24 DIAGNOSIS — Z87891 Personal history of nicotine dependence: Secondary | ICD-10-CM

## 2022-02-27 DIAGNOSIS — Z23 Encounter for immunization: Secondary | ICD-10-CM | POA: Diagnosis not present

## 2022-05-20 DIAGNOSIS — H524 Presbyopia: Secondary | ICD-10-CM | POA: Diagnosis not present

## 2022-09-08 DIAGNOSIS — I700 Atherosclerosis of aorta: Secondary | ICD-10-CM | POA: Diagnosis not present

## 2022-09-08 DIAGNOSIS — E559 Vitamin D deficiency, unspecified: Secondary | ICD-10-CM | POA: Diagnosis not present

## 2022-09-08 DIAGNOSIS — L219 Seborrheic dermatitis, unspecified: Secondary | ICD-10-CM | POA: Diagnosis not present

## 2022-09-08 DIAGNOSIS — M4185 Other forms of scoliosis, thoracolumbar region: Secondary | ICD-10-CM | POA: Diagnosis not present

## 2022-09-08 DIAGNOSIS — R918 Other nonspecific abnormal finding of lung field: Secondary | ICD-10-CM | POA: Diagnosis not present

## 2022-09-08 DIAGNOSIS — J439 Emphysema, unspecified: Secondary | ICD-10-CM | POA: Diagnosis not present

## 2022-09-08 DIAGNOSIS — Z0000 Encounter for general adult medical examination without abnormal findings: Secondary | ICD-10-CM | POA: Diagnosis not present

## 2022-09-08 DIAGNOSIS — M25552 Pain in left hip: Secondary | ICD-10-CM | POA: Diagnosis not present

## 2022-09-08 DIAGNOSIS — E7800 Pure hypercholesterolemia, unspecified: Secondary | ICD-10-CM | POA: Diagnosis not present

## 2022-09-08 DIAGNOSIS — E2839 Other primary ovarian failure: Secondary | ICD-10-CM | POA: Diagnosis not present

## 2022-09-08 DIAGNOSIS — R0989 Other specified symptoms and signs involving the circulatory and respiratory systems: Secondary | ICD-10-CM | POA: Diagnosis not present

## 2022-09-08 DIAGNOSIS — Z720 Tobacco use: Secondary | ICD-10-CM | POA: Diagnosis not present

## 2022-09-17 DIAGNOSIS — M5136 Other intervertebral disc degeneration, lumbar region: Secondary | ICD-10-CM | POA: Diagnosis not present

## 2022-09-17 DIAGNOSIS — M79604 Pain in right leg: Secondary | ICD-10-CM | POA: Diagnosis not present

## 2022-09-26 ENCOUNTER — Telehealth: Payer: Self-pay

## 2022-09-26 NOTE — Telephone Encounter (Signed)
Attempted to contact patient by phone to notify her that her insurance did not approve her request for LDCT due to age 76 years.  The insurance plan only covers to age 76 years.  The PCP office submitted the PA but our [facility]) was able to the PA was denied.  Since appt was still scheduled, this attempt was to make sure the patient is updated that CT will be cancelled.  Unable to reach patient by phone after multiple rings and no VM picked up so unable to leave message.  Only other contacts are neighbors.  Will cancel the LDCT.  Patient does not use mychart.  Will attempt to mail letter to patient but not sure if she will receive it prior to appt date

## 2022-10-02 ENCOUNTER — Other Ambulatory Visit: Payer: Medicare HMO

## 2022-10-21 DIAGNOSIS — M1611 Unilateral primary osteoarthritis, right hip: Secondary | ICD-10-CM | POA: Diagnosis not present

## 2023-08-14 DIAGNOSIS — H43813 Vitreous degeneration, bilateral: Secondary | ICD-10-CM | POA: Diagnosis not present

## 2023-08-14 DIAGNOSIS — H25813 Combined forms of age-related cataract, bilateral: Secondary | ICD-10-CM | POA: Diagnosis not present

## 2023-08-28 DIAGNOSIS — H2513 Age-related nuclear cataract, bilateral: Secondary | ICD-10-CM | POA: Diagnosis not present

## 2023-08-28 DIAGNOSIS — H2512 Age-related nuclear cataract, left eye: Secondary | ICD-10-CM | POA: Diagnosis not present

## 2023-08-28 DIAGNOSIS — H40013 Open angle with borderline findings, low risk, bilateral: Secondary | ICD-10-CM | POA: Diagnosis not present

## 2023-09-11 DIAGNOSIS — E559 Vitamin D deficiency, unspecified: Secondary | ICD-10-CM | POA: Diagnosis not present

## 2023-09-11 DIAGNOSIS — I700 Atherosclerosis of aorta: Secondary | ICD-10-CM | POA: Diagnosis not present

## 2023-09-11 DIAGNOSIS — E46 Unspecified protein-calorie malnutrition: Secondary | ICD-10-CM | POA: Diagnosis not present

## 2023-10-23 DIAGNOSIS — S00451A Superficial foreign body of right ear, initial encounter: Secondary | ICD-10-CM | POA: Diagnosis not present

## 2023-11-19 DIAGNOSIS — H2512 Age-related nuclear cataract, left eye: Secondary | ICD-10-CM | POA: Diagnosis not present

## 2023-11-20 DIAGNOSIS — H2511 Age-related nuclear cataract, right eye: Secondary | ICD-10-CM | POA: Diagnosis not present

## 2024-01-07 DIAGNOSIS — H2511 Age-related nuclear cataract, right eye: Secondary | ICD-10-CM | POA: Diagnosis not present

## 2024-01-13 DIAGNOSIS — Z961 Presence of intraocular lens: Secondary | ICD-10-CM | POA: Diagnosis not present

## 2024-01-13 DIAGNOSIS — H2511 Age-related nuclear cataract, right eye: Secondary | ICD-10-CM | POA: Diagnosis not present

## 2024-02-08 DIAGNOSIS — H524 Presbyopia: Secondary | ICD-10-CM | POA: Diagnosis not present

## 2024-02-08 DIAGNOSIS — Z0100 Encounter for examination of eyes and vision without abnormal findings: Secondary | ICD-10-CM | POA: Diagnosis not present

## 2024-02-22 DIAGNOSIS — H20012 Primary iridocyclitis, left eye: Secondary | ICD-10-CM | POA: Diagnosis not present

## 2024-02-24 DIAGNOSIS — H20012 Primary iridocyclitis, left eye: Secondary | ICD-10-CM | POA: Diagnosis not present

## 2024-02-26 DIAGNOSIS — H43823 Vitreomacular adhesion, bilateral: Secondary | ICD-10-CM | POA: Diagnosis not present

## 2024-02-26 DIAGNOSIS — H18232 Secondary corneal edema, left eye: Secondary | ICD-10-CM | POA: Diagnosis not present

## 2024-02-26 DIAGNOSIS — H35373 Puckering of macula, bilateral: Secondary | ICD-10-CM | POA: Diagnosis not present

## 2024-02-26 DIAGNOSIS — H20013 Primary iridocyclitis, bilateral: Secondary | ICD-10-CM | POA: Diagnosis not present

## 2024-02-26 DIAGNOSIS — Z961 Presence of intraocular lens: Secondary | ICD-10-CM | POA: Diagnosis not present

## 2024-02-26 DIAGNOSIS — H20012 Primary iridocyclitis, left eye: Secondary | ICD-10-CM | POA: Diagnosis not present

## 2024-05-19 DIAGNOSIS — J439 Emphysema, unspecified: Secondary | ICD-10-CM | POA: Diagnosis not present

## 2024-06-03 DIAGNOSIS — H3581 Retinal edema: Secondary | ICD-10-CM | POA: Diagnosis not present

## 2024-06-03 DIAGNOSIS — H30032 Focal chorioretinal inflammation, peripheral, left eye: Secondary | ICD-10-CM | POA: Diagnosis not present

## 2024-06-03 DIAGNOSIS — Z961 Presence of intraocular lens: Secondary | ICD-10-CM | POA: Diagnosis not present

## 2024-06-19 DIAGNOSIS — J439 Emphysema, unspecified: Secondary | ICD-10-CM | POA: Diagnosis not present

## 2024-07-19 DIAGNOSIS — J439 Emphysema, unspecified: Secondary | ICD-10-CM | POA: Diagnosis not present

## 2024-08-19 DIAGNOSIS — J439 Emphysema, unspecified: Secondary | ICD-10-CM | POA: Diagnosis not present

## 2024-09-18 DIAGNOSIS — J439 Emphysema, unspecified: Secondary | ICD-10-CM | POA: Diagnosis not present

## 2024-10-03 DIAGNOSIS — E46 Unspecified protein-calorie malnutrition: Secondary | ICD-10-CM | POA: Diagnosis not present

## 2024-10-03 DIAGNOSIS — I2510 Atherosclerotic heart disease of native coronary artery without angina pectoris: Secondary | ICD-10-CM | POA: Diagnosis not present

## 2024-10-03 DIAGNOSIS — I700 Atherosclerosis of aorta: Secondary | ICD-10-CM | POA: Diagnosis not present

## 2024-10-03 DIAGNOSIS — E559 Vitamin D deficiency, unspecified: Secondary | ICD-10-CM | POA: Diagnosis not present
